# Patient Record
Sex: Female | Born: 1975 | Race: White | Hispanic: No | Marital: Married | Smoking: Never smoker
Health system: Southern US, Community
[De-identification: ages and names within clinical notes are randomized; demographics above are authoritative.]

## PROBLEM LIST (undated history)

## (undated) DIAGNOSIS — J189 Pneumonia, unspecified organism: Secondary | ICD-10-CM

## (undated) DIAGNOSIS — R519 Headache, unspecified: Secondary | ICD-10-CM

## (undated) DIAGNOSIS — D649 Anemia, unspecified: Secondary | ICD-10-CM

## (undated) HISTORY — PX: DILATION AND CURETTAGE OF UTERUS: SHX78

## (undated) HISTORY — PX: TUBAL LIGATION: SHX77

---

## 2005-10-19 ENCOUNTER — Emergency Department: Payer: Self-pay | Admitting: General Practice

## 2008-05-31 ENCOUNTER — Emergency Department: Payer: Self-pay | Admitting: Emergency Medicine

## 2013-03-16 ENCOUNTER — Emergency Department: Payer: Self-pay | Admitting: Emergency Medicine

## 2013-09-23 ENCOUNTER — Emergency Department: Payer: Self-pay | Admitting: Emergency Medicine

## 2013-09-26 LAB — BETA STREP CULTURE(ARMC)

## 2014-02-02 ENCOUNTER — Emergency Department: Payer: Self-pay | Admitting: Emergency Medicine

## 2014-02-02 LAB — CBC
HCT: 42.3 % (ref 35.0–47.0)
HGB: 13.7 g/dL (ref 12.0–16.0)
MCH: 28.8 pg (ref 26.0–34.0)
MCHC: 32.4 g/dL (ref 32.0–36.0)
MCV: 89 fL (ref 80–100)
PLATELETS: 215 10*3/uL (ref 150–440)
RBC: 4.76 10*6/uL (ref 3.80–5.20)
RDW: 12.9 % (ref 11.5–14.5)
WBC: 9.1 10*3/uL (ref 3.6–11.0)

## 2014-02-02 LAB — URINALYSIS, COMPLETE
Bilirubin,UR: NEGATIVE
Blood: NEGATIVE
Glucose,UR: NEGATIVE mg/dL (ref 0–75)
Hyaline Cast: 3
KETONE: NEGATIVE
Leukocyte Esterase: NEGATIVE
Nitrite: NEGATIVE
PROTEIN: NEGATIVE
Ph: 5 (ref 4.5–8.0)
Specific Gravity: 1.032 (ref 1.003–1.030)
WBC UR: 6 /HPF (ref 0–5)

## 2014-02-02 LAB — COMPREHENSIVE METABOLIC PANEL
ANION GAP: 9 (ref 7–16)
Albumin: 3.3 g/dL — ABNORMAL LOW (ref 3.4–5.0)
Alkaline Phosphatase: 59 U/L
BUN: 13 mg/dL (ref 7–18)
Bilirubin,Total: 0.8 mg/dL (ref 0.2–1.0)
CHLORIDE: 106 mmol/L (ref 98–107)
CREATININE: 0.83 mg/dL (ref 0.60–1.30)
Calcium, Total: 8.5 mg/dL (ref 8.5–10.1)
Co2: 26 mmol/L (ref 21–32)
EGFR (Non-African Amer.): >60
Glucose: 95 mg/dL (ref 65–99)
Osmolality: 281 (ref 275–301)
POTASSIUM: 3.6 mmol/L (ref 3.5–5.1)
SGOT(AST): 19 U/L (ref 15–37)
SGPT (ALT): 34 U/L
Sodium: 141 mmol/L (ref 136–145)
Total Protein: 6.6 g/dL (ref 6.4–8.2)

## 2014-02-02 LAB — LIPASE, BLOOD: LIPASE: 100 U/L (ref 73–393)

## 2014-11-29 ENCOUNTER — Emergency Department
Admission: EM | Admit: 2014-11-29 | Discharge: 2014-11-29 | Discharge status: Against Medical Advice | Payer: Self-pay | Attending: Emergency Medicine | Admitting: Emergency Medicine

## 2014-11-29 ENCOUNTER — Encounter: Payer: Self-pay | Admitting: Emergency Medicine

## 2014-11-29 DIAGNOSIS — R42 Dizziness and giddiness: Secondary | ICD-10-CM | POA: Insufficient documentation

## 2014-11-29 DIAGNOSIS — R110 Nausea: Secondary | ICD-10-CM | POA: Insufficient documentation

## 2014-11-29 DIAGNOSIS — R1032 Left lower quadrant pain: Secondary | ICD-10-CM | POA: Insufficient documentation

## 2014-11-29 MED ORDER — ONDANSETRON 8 MG PO TBDP: 8.0000 mg | ORAL_TABLET | Freq: Once | ORAL | Status: DC

## 2014-11-29 MED ORDER — MECLIZINE HCL 25 MG PO TABS: 50.0000 mg | ORAL_TABLET | Freq: Once | ORAL | Status: DC

## 2014-11-29 MED ORDER — SODIUM CHLORIDE 0.9 % IV BOLUS (SEPSIS): 1000.0000 mL | Freq: Once | INTRAVENOUS | Status: DC

## 2014-11-29 NOTE — ED Provider Notes (Signed)
[facility] Emergency Department Provider Note  ____________________________________________  Time seen: 7:40 AM  I have reviewed the triage vital signs and the nursing notes.   HISTORY  Chief Complaint Dizziness and Nausea    HPI Cathy Cooper is a 39 y.o.  female who complains of moderate intensity left flank pain radiating to her left lower quadrant for the past 2 weeks. This pain is sharp, intermittent lasting a few minutes, no aggravating or alleviating factors. No significant associated symptoms such as vomiting diarrhea dysuria urgency hematuria. She does report some urinary frequency with increased thirst as well. No medical history that she knows of. She also reports that for the last 4 days she has been feeling nauseated and dizzy which she describes as lightheadedness which is worse when she stands up. She does note that she feels generally dizzy all the time although it does worsen when she is upright. No fevers or chills, no headache no chest pain shortness of breath no earache or recent illness.   LMP 1 week ago. Patient is status post tubal ligation . Patient denies vaginal bleeding or discharge or pelvic discomfort   History reviewed. No pertinent past medical history.  There are no active problems to display for this patient.   History reviewed. No pertinent past surgical history.  bilateral tubal ligation  No current outpatient prescriptions on file.  Allergies Review of patient's allergies indicates no known allergies.  History reviewed. No pertinent family history.  Social History History  Substance Use Topics  . Smoking status: Never Smoker   . Smokeless tobacco: Never Used  . Alcohol Use: No    Review of Systems  Constitutional: No fever or chills. No weight changes Eyes:No blurry vision or double vision.  ENT: No sore throat. Cardiovascular: No chest pain. Respiratory: No dyspnea or cough. GastrointestPositive left  lower quadrant pain without vomiting or diarrheaa.  No BRBPR or melena. Genitourinary: Negative for dysuria, urinary retention, bloody urine, or difficulty urinating. positive urinary frequency  Musculoskeletal: Negative for back pain. No joint swelling or pain. Skin: Negative for rash. Neurological: Negative for headaches, focal weakness or numbness. Psychiatric:No anxiety or depression.   Endocrine:No hot/cold intolerance, changes in energy, or sleep difficulty.  10-point ROS otherwise negative.  ____________________________________________   PHYSICAL EXAM:  VITAL SIGNS: ED Triage Vitals  Enc Vitals Group     BP 11/29/14 0737 106/55 mmHg     Pulse Rate 11/29/14 0737 78     Resp 11/29/14 0737 22     Temp 11/29/14 0737 97.6 F (36.4 C)     Temp Source 11/29/14 0737 Oral     SpO2 11/29/14 0737 97 %     Weight 11/29/14 0737 215 lb (97.523 kg)     Height 11/29/14 0737 5' 7"  (1.702 m)     Head Cir --      Peak Flow --      Pain Score --      Pain Loc --      Pain Edu? --      Excl. in [place]? --      Constitutional: Alert and oriented. Well appearing and in no distress. Eyes: No scleral icterus. No conjunctival pallor. PERRL. EOMI ENT   Head: Normocephalic and atraumatic.   Nose: No congestion/rhinnorhea. No septal hematoma   Mouth/ThroatDry mucous membranes, no pharyngeal erythema. No peritonsillar mass. No uvula shift.   Neck: No stridor. No SubQ emphysema. No meningismus. Hematological/Lymphatic/Immunilogical: No cervical lymphadenopathy. Cardiovascular: RRR. Normal and symmetric  distal pulses are present in all extremities. No murmurs, rubs, or gallops. Respiratory: Normal respiratory effort without tachypnea nor retractions. Breath sounds are clear and equal bilaterally. No wheezes/rales/rhonchi. GastrointestSoft with suprapubic and left lower quadrant tenderness. No distention. There is no CVA tenderness.  No rebound, rigidity, or guarding. Genitourinary:  deferred Musculoskeletal: Nontender with normal range of motion in all extremities. No joint effusions.  No lower extremity tenderness.  No edema. Neurologic:   Normal speech and language.  CN 2-10 normal. Motor grossly intact. No pronator drift.  Normal gait. No gross focal neurologic deficits are appreciated.  Skin:  Skin is warm, dry and intact. No rash noted.  No petechiae, purpura, or bullae. Psychiatric: Mood and affect are normal. Speech and behavior are normal. Patient exhibits appropriate insight and judgment.  ____________________________________________    LABS (pertinent positives/negatives) (all labs ordered are listed, but only abnormal results are displayed) Labs Reviewed  BASIC METABOLIC PANEL  CBC WITH DIFFERENTIAL/PLATELET  URINALYSIS COMPLETEWITH MICROSCOPIC ([place])  CBG MONITORING, ED  POC URINE PREG, ED   ____________________________________________   EKG    ____________________________________________    RADIOLOGY    ____________________________________________   PROCEDURES  ____________________________________________   INITIAL IMPRESSION / [place] / ED COURSE  Pertinent labs & imaging results that were available during my care of the patient were reviewed by me and considered in my medical decision making (see chart for details).  patient presents with abdominal pain of unclear etiology, possibly urinary tract infection, renal colic. Low suspicion of ectopic or torsion. Low suspicion of appendicitis or cholecystitis or AAA or surgical pathology. As result of this the patient's likely become dehydrated especially with very high ambient temperatures in the 80s recently. She does appear to be mildly dehydrated clinically sober give her IV fluids while getting labs.  ----------------------------------------- 11:29 AM on 11/29/2014 -----------------------------------------  When the nurse went to go start an IV and give  fluids and draw labs, she noted that the patient's gown was on the bed and the patient was not in the room. The patient's belongings were gone. It appears the patient has eloped. By my initial evaluation, the patient does have medical decision-making capacity and is able to make this decision for herself. She did not appear to be in distress is likely medically stable at this time, although workup would've been helpful to evaluate this. __________________________________________   FINAL CLINICAL IMPRESSION(S) / ED DIAGNOSES  Final diagnoses:  Abdominal pain, left lower quadrant      Carrie Mew, MD 11/29/14 1130

## 2014-11-29 NOTE — ED Notes (Signed)
Patient to ED with c/o dizziness and nausea since Thursday, reports room is spinning whenever she gets up. Patient also reports Left flank/LLQ pain.

## 2016-03-14 ENCOUNTER — Emergency Department
Admission: EM | Admit: 2016-03-14 | Discharge: 2016-03-14 | Disposition: A | Discharge status: Home / Self Care | Payer: BLUE CROSS/BLUE SHIELD | Attending: Emergency Medicine | Admitting: Emergency Medicine

## 2016-03-14 ENCOUNTER — Encounter: Payer: Self-pay | Admitting: Emergency Medicine

## 2016-03-14 ENCOUNTER — Emergency Department: Payer: BLUE CROSS/BLUE SHIELD

## 2016-03-14 DIAGNOSIS — R1032 Left lower quadrant pain: Secondary | ICD-10-CM | POA: Diagnosis not present

## 2016-03-14 DIAGNOSIS — R52 Pain, unspecified: Secondary | ICD-10-CM

## 2016-03-14 LAB — URINALYSIS COMPLETE WITH MICROSCOPIC (ARMC ONLY)
BACTERIA UA: NONE SEEN
Bilirubin Urine: NEGATIVE
Glucose, UA: NEGATIVE mg/dL
KETONES UR: NEGATIVE mg/dL
Leukocytes, UA: NEGATIVE
NITRITE: NEGATIVE
PH: 7 (ref 5.0–8.0)
PROTEIN: NEGATIVE mg/dL
Specific Gravity, Urine: 1.018 (ref 1.005–1.030)

## 2016-03-14 LAB — HEPATIC FUNCTION PANEL
ALT: 21 U/L (ref 14–54)
AST: 21 U/L (ref 15–41)
Albumin: 3.6 g/dL (ref 3.5–5.0)
Alkaline Phosphatase: 45 U/L (ref 38–126)
Bilirubin, Direct: 0.1 mg/dL (ref 0.1–0.5)
Indirect Bilirubin: 0.9 mg/dL (ref 0.3–0.9)
TOTAL PROTEIN: 6.5 g/dL (ref 6.5–8.1)
Total Bilirubin: 1 mg/dL (ref 0.3–1.2)

## 2016-03-14 LAB — WET PREP, GENITAL
Clue Cells Wet Prep HPF POC: NONE SEEN
SPERM: NONE SEEN
Trich, Wet Prep: NONE SEEN
Yeast Wet Prep HPF POC: NONE SEEN

## 2016-03-14 LAB — BASIC METABOLIC PANEL
ANION GAP: 6 (ref 5–15)
BUN: 16 mg/dL (ref 6–20)
CALCIUM: 8.5 mg/dL — AB (ref 8.9–10.3)
CO2: 24 mmol/L (ref 22–32)
Chloride: 110 mmol/L (ref 101–111)
Creatinine, Ser: 0.72 mg/dL (ref 0.44–1.00)
GFR calc Af Amer: >60 mL/min (ref >60)
GLUCOSE: 89 mg/dL (ref 65–99)
Potassium: 4.1 mmol/L (ref 3.5–5.1)
SODIUM: 140 mmol/L (ref 135–145)

## 2016-03-14 LAB — CBC
HCT: 39.3 % (ref 35.0–47.0)
HEMOGLOBIN: 14.1 g/dL (ref 12.0–16.0)
MCH: 30.6 pg (ref 26.0–34.0)
MCHC: 35.8 g/dL (ref 32.0–36.0)
MCV: 85.5 fL (ref 80.0–100.0)
Platelets: 227 10*3/uL (ref 150–440)
RBC: 4.6 MIL/uL (ref 3.80–5.20)
RDW: 12.6 % (ref 11.5–14.5)
WBC: 6.2 10*3/uL (ref 3.6–11.0)

## 2016-03-14 LAB — CHLAMYDIA/NGC RT PCR (ARMC ONLY)
Chlamydia Tr: NOT DETECTED
N gonorrhoeae: NOT DETECTED

## 2016-03-14 LAB — POCT PREGNANCY, URINE: PREG TEST UR: NEGATIVE

## 2016-03-14 LAB — LIPASE, BLOOD: LIPASE: 24 U/L (ref 11–51)

## 2016-03-14 MED ORDER — ONDANSETRON HCL 4 MG/2ML IJ SOLN
4.0000 mg | Freq: Once | INTRAMUSCULAR | Status: AC
  Administered 2016-03-14: 4 mg via INTRAVENOUS
  Filled 2016-03-14: qty 2

## 2016-03-14 MED ORDER — SODIUM CHLORIDE 0.9 % IV BOLUS (SEPSIS)
500.0000 mL | Freq: Once | INTRAVENOUS | Status: AC
  Administered 2016-03-14: 500 mL via INTRAVENOUS

## 2016-03-14 MED ORDER — MORPHINE SULFATE (PF) 4 MG/ML IV SOLN
4.0000 mg | Freq: Once | INTRAVENOUS | Status: AC
  Administered 2016-03-14: 4 mg via INTRAVENOUS
  Filled 2016-03-14: qty 1

## 2016-03-14 MED ORDER — KETOROLAC TROMETHAMINE 30 MG/ML IJ SOLN
15.0000 mg | Freq: Once | INTRAMUSCULAR | Status: AC
  Administered 2016-03-14: 15 mg via INTRAMUSCULAR
  Filled 2016-03-14: qty 1

## 2016-03-14 MED ORDER — IOPAMIDOL (ISOVUE-300) INJECTION 61%
30.0000 mL | Freq: Once | INTRAVENOUS | Status: AC
  Administered 2016-03-14: 30 mL via ORAL

## 2016-03-14 MED ORDER — IOPAMIDOL (ISOVUE-300) INJECTION 61%
100.0000 mL | Freq: Once | INTRAVENOUS | Status: AC | PRN
  Administered 2016-03-14: 100 mL via INTRAVENOUS

## 2016-03-14 NOTE — ED Provider Notes (Signed)
-----------------------------------------   3:14 PM on 03/14/2016 -----------------------------------------   Blood pressure (!) 114/53, pulse 92, temperature 98 F (36.7 C), temperature source Oral, resp. rate 18, height 5' 7"  (1.702 m), weight 215 lb (97.5 kg), last menstrual period 03/12/2016, SpO2 99 %.  Assuming care from Dr. Edd Fabian of [facility] is a 40 y.o. female with a chief complaint of Flank Pain .    In summary, 34F who presents for evaluation of 2 weeks of left lower quadrant abdominal pain. Labs WNL. VS stable. Patient with moderate ttp over the LLQ per Dr. Edd Fabian.  Plan for pelvic US and if negative to pursue CT a/p.  _________________________ 7:00 PM on 03/14/2016 ----------------------------------------- CT negative for acute findings. I re-examined patient and abdomen is soft and non tender to palpation. VS stable. Patient will be discharged home with follow up at [facility] clinic.  I discussed my evaluation of the patient's symptoms, my clinical impression, and my proposed outpatient treatment plan with patient. We have discussed anticipatory guidance, scheduled follow-up, and careful return precautions. The patient expresses understanding and is comfortable with the discharge plan. All patient's questions were answered.      Rudene Re, MD 03/14/16 1902

## 2016-03-14 NOTE — ED Notes (Signed)
Pt returned from US

## 2016-03-14 NOTE — ED Notes (Signed)
Pt advised that she just used the restroom and did not feel she needed to go at the time of triage.

## 2016-03-14 NOTE — ED Notes (Signed)
Pt transported to ultrasound.

## 2016-03-14 NOTE — ED Notes (Signed)

## 2016-03-14 NOTE — ED Triage Notes (Signed)
Pt c/o left flank pain that radiates to left groin. Denies NVD. Denies urinary sx. Denies vaginal discharge.

## 2016-03-14 NOTE — ED Provider Notes (Signed)
[facility] Emergency Department Provider Note   ____________________________________________   First MD Initiated Contact with Patient 03/14/16 1245     (approximate)  I have reviewed the triage vital signs and the nursing notes.   HISTORY  Chief Complaint Flank Pain    HPI Cathy Cooper is a 40 y.o. female with no chronic medical problems who presents for evaluation of 2 weeks of left lower quadrant pain wrapping around to the left flank, worse when she walks, currently moderate. She denies any nausea, vomiting, diarrhea, fevers or chills. No pain or burning with urination, no hematuria. No abnormal vaginal bleeding or vaginal discharge. Menstrual period started 2 days ago, currently menstruating. She denies any concern for sexual transmitted infection stating that she is in a mutually monogamous sexual relationship with her husband.   History reviewed. No pertinent past medical history.  There are no active problems to display for this patient.   History reviewed. No pertinent surgical history.  Prior to Admission medications   Not on File    Allergies Review of patient's allergies indicates no known allergies.  History reviewed. No pertinent family history.  Social History Social History  Substance Use Topics  . Smoking status: Never Smoker  . Smokeless tobacco: Never Used  . Alcohol use No    Review of Systems Constitutional: No fever/chills Eyes: No visual changes. ENT: No sore throat. Cardiovascular: Denies chest pain. Respiratory: Denies shortness of breath. Gastrointestinal: + abdominal pain.  No nausea, no vomiting.  No diarrhea.  No constipation. Genitourinary: Negative for dysuria. Musculoskeletal: Negative for back pain. Skin: Negative for rash. Neurological: Negative for headaches, focal weakness or numbness.  10-point ROS otherwise negative.  ____________________________________________   PHYSICAL  EXAM:  VITAL SIGNS: ED Triage Vitals  Enc Vitals Group     BP 03/14/16 1057 (!) 114/53     Pulse Rate 03/14/16 1057 92     Resp 03/14/16 1057 18     Temp 03/14/16 1057 98 F (36.7 C)     Temp Source 03/14/16 1057 Oral     SpO2 03/14/16 1057 99 %     Weight 03/14/16 1056 215 lb (97.5 kg)     Height 03/14/16 1056 5' 7"  (1.702 m)     Head Circumference --      Peak Flow --      Pain Score 03/14/16 1056 8     Pain Loc --      Pain Edu? --      Excl. in [employer]? --     Constitutional: Alert and oriented. Well appearing and in no acute distress. Eyes: Conjunctivae are normal. PERRL. EOMI. Head: Atraumatic. Nose: No congestion/rhinnorhea. Mouth/Throat: Mucous membranes are moist.  Oropharynx non-erythematous. Neck: No stridor.  Supple without meningismus. Cardiovascular: Normal rate, regular rhythm. Grossly normal heart sounds.  Good peripheral circulation. Respiratory: Normal respiratory effort.  No retractions. Lungs CTAB. Gastrointestinal: Soft with tenderness to palpation in the left lower quadrant and over the left iliac crest. No CVA tenderness. Pelvic: small amount of dark bloody mucous from clsed OS, No bimanual or cervical motion tenderness. Musculoskeletal: No lower extremity tenderness nor edema.  No joint effusions. Neurologic:  Normal speech and language. No gross focal neurologic deficits are appreciated. No gait instability. Skin:  Skin is warm, dry and intact. No rash noted. Psychiatric: Mood and affect are normal. Speech and behavior are normal.  ____________________________________________   LABS (all labs ordered are listed, but only abnormal results are displayed)  Labs  Reviewed  WET PREP, GENITAL - Abnormal; Notable for the following:       Result Value   WBC, Wet Prep HPF POC MODERATE (*)    All other components within normal limits  URINALYSIS COMPLETEWITH MICROSCOPIC ([facility] ONLY) - Abnormal; Notable for the following:    Color, Urine YELLOW (*)     APPearance CLOUDY (*)    Hgb urine dipstick 1+ (*)    Squamous Epithelial / LPF 0-5 (*)    All other components within normal limits  BASIC METABOLIC PANEL - Abnormal; Notable for the following:    Calcium 8.5 (*)    All other components within normal limits  CHLAMYDIA/NGC RT PCR ([facility] ONLY)  CBC  HEPATIC FUNCTION PANEL  LIPASE, BLOOD  POC URINE PREG, ED  POCT PREGNANCY, URINE   ____________________________________________  EKG  none ____________________________________________  RADIOLOGY  Transvaginal ultrasound - pending ____________________________________________   PROCEDURES  Procedure(s) performed: None  Procedures  Critical Care performed: No  ____________________________________________   INITIAL IMPRESSION / ASSESSMENT AND PLAN / ED COURSE  Pertinent labs & imaging results that were available during my care of the patient were reviewed by me and considered in my medical decision making (see chart for details).  Cathy Cooper is a 40 y.o. female with no chronic medical problems who presents for evaluation of 2 weeks of left lower quadrant pain wrapping around to the left flank, worse when she walks. On exam, she is well-appearing and in no acute distress. Vital signs stable, she is afebrile. She does have tenderness to palpation in the left lower quadrant, over the iliac crest as well. No rebound or guarding. No appreciable hernia sac or bulge. We'll obtain screening labs including urinalysis, urine pregnancy test, transvaginal ultrasound to evaluate for any acute ovarian pathology such as torsion, treat her symptomatically and reassess for disposition.  ----------------------------------------- 3:01 PM on 03/14/2016 ----------------------------------------- CBC, CMP , Lipase unremarkable. Preg negative. Wet prep negative. UA not consistent with infection. Awaiting ultrasound, care transferred to Dr. Alfred Levins.   Clinical Course      ____________________________________________   FINAL CLINICAL IMPRESSION(S) / ED DIAGNOSES  Final diagnoses:  Pain  LLQ pain      NEW MEDICATIONS STARTED DURING THIS VISIT:  New Prescriptions   No medications on file     Note:  This document was prepared using Dragon voice recognition software and may include unintentional dictation errors.    Joanne Gavel, MD 03/14/16 [phone]

## 2016-03-14 NOTE — ED Notes (Signed)
Patient transported to Ultrasound 

## 2016-03-14 NOTE — Discharge Instructions (Signed)

## 2016-05-17 ENCOUNTER — Emergency Department: Payer: BLUE CROSS/BLUE SHIELD

## 2016-05-17 ENCOUNTER — Emergency Department
Admission: EM | Admit: 2016-05-17 | Discharge: 2016-05-17 | Disposition: A | Discharge status: Home / Self Care | Payer: BLUE CROSS/BLUE SHIELD | Attending: Emergency Medicine | Admitting: Emergency Medicine

## 2016-05-17 DIAGNOSIS — R197 Diarrhea, unspecified: Secondary | ICD-10-CM | POA: Insufficient documentation

## 2016-05-17 DIAGNOSIS — R112 Nausea with vomiting, unspecified: Secondary | ICD-10-CM

## 2016-05-17 DIAGNOSIS — G43809 Other migraine, not intractable, without status migrainosus: Secondary | ICD-10-CM

## 2016-05-17 LAB — URINALYSIS, COMPLETE (UACMP) WITH MICROSCOPIC
BACTERIA UA: NONE SEEN
BILIRUBIN URINE: NEGATIVE
Glucose, UA: NEGATIVE mg/dL
HGB URINE DIPSTICK: NEGATIVE
Ketones, ur: NEGATIVE mg/dL
LEUKOCYTES UA: NEGATIVE
Nitrite: NEGATIVE
PROTEIN: NEGATIVE mg/dL
Specific Gravity, Urine: 1.027 (ref 1.005–1.030)
pH: 6 (ref 5.0–8.0)

## 2016-05-17 LAB — INFLUENZA PANEL BY PCR (TYPE A & B)
INFLAPCR: NEGATIVE
Influenza B By PCR: NEGATIVE

## 2016-05-17 LAB — CBC
HEMATOCRIT: 40.6 % (ref 35.0–47.0)
Hemoglobin: 14 g/dL (ref 12.0–16.0)
MCH: 30.2 pg (ref 26.0–34.0)
MCHC: 34.5 g/dL (ref 32.0–36.0)
MCV: 87.6 fL (ref 80.0–100.0)
Platelets: 228 10*3/uL (ref 150–440)
RBC: 4.64 MIL/uL (ref 3.80–5.20)
RDW: 12.8 % (ref 11.5–14.5)
WBC: 8.2 10*3/uL (ref 3.6–11.0)

## 2016-05-17 LAB — BASIC METABOLIC PANEL
Anion gap: 5 (ref 5–15)
BUN: 15 mg/dL (ref 6–20)
CHLORIDE: 107 mmol/L (ref 101–111)
CO2: 24 mmol/L (ref 22–32)
Calcium: 8.3 mg/dL — ABNORMAL LOW (ref 8.9–10.3)
Creatinine, Ser: 0.61 mg/dL (ref 0.44–1.00)
GFR calc Af Amer: >60 mL/min (ref >60)
GFR calc non Af Amer: >60 mL/min (ref >60)
Glucose, Bld: 122 mg/dL — ABNORMAL HIGH (ref 65–99)
POTASSIUM: 3.4 mmol/L — AB (ref 3.5–5.1)
SODIUM: 136 mmol/L (ref 135–145)

## 2016-05-17 LAB — POCT PREGNANCY, URINE: Preg Test, Ur: NEGATIVE

## 2016-05-17 MED ORDER — KETOROLAC TROMETHAMINE 30 MG/ML IJ SOLN
30.0000 mg | Freq: Once | INTRAMUSCULAR | Status: AC
  Administered 2016-05-17: 30 mg via INTRAVENOUS
  Filled 2016-05-17: qty 1

## 2016-05-17 MED ORDER — METOCLOPRAMIDE HCL 5 MG/ML IJ SOLN
10.0000 mg | Freq: Once | INTRAMUSCULAR | Status: AC
  Administered 2016-05-17: 10 mg via INTRAVENOUS
  Filled 2016-05-17 (×2): qty 2

## 2016-05-17 MED ORDER — METOCLOPRAMIDE HCL 5 MG PO TABS: 5.0000 mg | ORAL_TABLET | Freq: Four times a day (QID) | ORAL | 0 refills | Status: DC | PRN

## 2016-05-17 MED ORDER — LOPERAMIDE HCL 2 MG PO TABS: 2.0000 mg | ORAL_TABLET | Freq: Four times a day (QID) | ORAL | 0 refills | Status: DC | PRN

## 2016-05-17 MED ORDER — KETOROLAC TROMETHAMINE 10 MG PO TABS: 10.0000 mg | ORAL_TABLET | Freq: Three times a day (TID) | ORAL | 0 refills | Status: DC | PRN

## 2016-05-17 MED ORDER — SODIUM CHLORIDE 0.9 % IV BOLUS (SEPSIS)
1000.0000 mL | Freq: Once | INTRAVENOUS | Status: AC
  Administered 2016-05-17: 1000 mL via INTRAVENOUS

## 2016-05-17 NOTE — Discharge Instructions (Signed)
Please drink plenty of fluids stay well-hydrated. You may take loperamide for diarrhea, and Reglan for nausea. Toradol was for mild to moderate pain. Do not take Toradol with other NSAID medications including Motrin, Aleve, ibuprofen or Advil.  Return to the emergency department if he develops severe pain, fever, inability to keep down fluids, or any other symptoms concerning to you.

## 2016-05-17 NOTE — ED Triage Notes (Addendum)
Pt presents to ED with c/o headache, N/V/D, and generalized extremity weakness x2 days. Pt reports feeling warm, but denies actually taking her temperature. Pt reports s/x's started 2 days ago, with generalized body weakness, 5 episodes of emesis, and 9 episodes of diarrhea in the last 24 hrs. Pt denies any change in bowel or bladder habits (other than recent diarrhea); denies any recent sick contacts.

## 2016-05-17 NOTE — ED Provider Notes (Signed)
[facility] Emergency Department Provider Note  ____________________________________________  Time seen: Approximately 8:40 PM  I have reviewed the triage vital signs and the nursing notes.   HISTORY  Chief Complaint Headache; Emesis; Diarrhea; and Fatigue    HPI Cathy Cooper is a 40 y.o. female with a history of migraines presenting with headache, bilateral arm tingling and weakness, photophobia, phonophobia, nausea vomiting and diarrhea. The patient's symptoms started 2-3 days ago and have gotten progressively worse. She denies any fever, chills, blurred or double vision, difficulty walking, cough or cold symptoms. No abdominal pain.   History reviewed. No pertinent past medical history.  There are no active problems to display for this patient.   History reviewed. No pertinent surgical history.    Allergies Patient has no known allergies.  No family history on file.  Social History Social History  Substance Use Topics  . Smoking status: Never Smoker  . Smokeless tobacco: Never Used  . Alcohol use No    Review of Systems Constitutional: No fever/chills.No lightheadedness or syncope. Eyes:  No blurred or double vision. Positive photophobia. ENT: No sore throat. No congestion or rhinorrhea. No ear pain. Cardiovascular: Denies chest pain. Denies palpitations. Respiratory: Denies shortness of breath.  No cough. Gastrointestinal: No abdominal pain.  Positive nausea, positive vomiting.  Positive diarrhea.  No constipation. Genitourinary: Negative for dysuria. Musculoskeletal: Negative for back pain. Skin: Negative for rash. Neurological: Positive for headaches. Positive bilateral right greater than left upper extremity tingling without numbness but associated with weakness. 10-point ROS otherwise negative.  ____________________________________________   PHYSICAL EXAM:  VITAL SIGNS: ED Triage Vitals [05/17/16 1920]  Enc Vitals Group      BP 106/61     Pulse Rate 99     Resp 16     Temp 98.6 F (37 C)     Temp Source Oral     SpO2 97 %     Weight 215 lb (97.5 kg)     Height 5' 7"  (1.702 m)     Head Circumference      Peak Flow      Pain Score 8     Pain Loc      Pain Edu?      Excl. in [place]?      Constitutional: Alert and oriented. Uncomfortable appearing but nontoxic.Marland Kitchen Answers questions appropriately. Eyes: Conjunctivae are normal.  EOMI. PERRLA. No scleral icterus. Head: Atraumatic. Nose: No congestion/rhinnorhea. Mouth/Throat: Mucous membranes are moist.  Neck: No stridor.  Supple.  No meningismus. Cardiovascular: Normal rate, regular rhythm. No murmurs, rubs or gallops.  Respiratory: Normal respiratory effort.  No accessory muscle use or retractions. Lungs CTAB.  No wheezes, rales or ronchi. Gastrointestinal: Overweight. Soft, nontender and nondistended.  No guarding or rebound.  No peritoneal signs. Musculoskeletal: No LE edema.  Neurologic: Alert and oriented 3. Speech is clear.  Face and smile symmetric. Tongue is midline. EOMI. PERRLA. No horizontal or vertical nystagmus. No pronator drift. 5 out of 5 grip, biceps, triceps, hip flexors, plantar flexion and dorsiflexion. Normal sensation to light touch in the bilateral upper and lower extremities, and face. Normal gait without ataxia. Skin:  Skin is warm, dry and intact. No rash noted. Psychiatric: Mood and affect are normal. Speech and behavior are normal.  Normal judgement.  ____________________________________________   LABS (all labs ordered are listed, but only abnormal results are displayed)  Labs Reviewed  BASIC METABOLIC PANEL - Abnormal; Notable for the following:       Result  Value   Potassium 3.4 (*)    Glucose, Bld 122 (*)    Calcium 8.3 (*)    All other components within normal limits  URINALYSIS, COMPLETE ([facility]) WITH MICROSCOPIC - Abnormal; Notable for the following:    Color, Urine YELLOW (*)    APPearance CLEAR (*)    Squamous  Epithelial / LPF 6-30 (*)    All other components within normal limits  CBC  INFLUENZA PANEL BY PCR (TYPE A & B, H1N1)  POC URINE PREG, ED  POCT PREGNANCY, URINE   ____________________________________________  EKG  Not indicated ____________________________________________  RADIOLOGY  Ct Head Wo Contrast  Result Date: 05/17/2016 CLINICAL DATA:  Headache and bilateral arm tingling. Nausea and vomiting. Weakness. EXAM: CT HEAD WITHOUT CONTRAST TECHNIQUE: Contiguous axial images were obtained from the base of the skull through the vertex without intravenous contrast. COMPARISON:  None. FINDINGS: Brain: No evidence of acute infarction, hemorrhage, hydrocephalus, extra-axial collection or mass lesion/mass effect. Vascular: No hyperdense vessel or unexpected calcification. Skull: Normal. Negative for fracture or focal lesion. Sinuses/Orbits: Paranasal sinuses and mastoid air cells are clear. The visualized orbits are unremarkable. Other: None. IMPRESSION: No acute intracranial abnormality. Electronically Signed   By: Jeb Levering M.D.   On: 05/17/2016 21:14    ____________________________________________   PROCEDURES  Procedure(s) performed: None  Procedures  Critical Care performed: No ____________________________________________   INITIAL IMPRESSION / ASSESSMENT AND PLAN / ED COURSE  Pertinent labs & imaging results that were available during my care of the patient were reviewed by me and considered in my medical decision making (see chart for details).  40 y.o. female with a history of migraines presenting with 3 days of generalized malaise, headache, photophobia, phonophobia, vomiting and diarrhea. The patient has reassuring vital signs and is afebrile. She does not have signs and symptoms that would be consistent with meningitis or intracranial bleeding. However, given that she has had intermittent tingling in the upper extremities, August CT scan of the head. At this time,  the patient is neurologically intact. I will also check the patient for flu. Her urinalysis is negative for infection and her electrolytes are reassuring. I will initiate symptomatically treatment and reevaluate the patient for final disposition.  ----------------------------------------- 9:35 PM on 05/17/2016 -----------------------------------------  The patient's pain has him as completely resolved, and she is no longer nauseated. She is tolerating liquid by mouth. Her CT scan does not show any acute process and her flu test is negative. Plan discharge home with close PMD follow-up. She understands return precautions.  ____________________________________________  FINAL CLINICAL IMPRESSION(S) / ED DIAGNOSES  Final diagnoses:  Other migraine without status migrainosus, not intractable  Nausea vomiting and diarrhea    Clinical Course       NEW MEDICATIONS STARTED DURING THIS VISIT:  New Prescriptions   KETOROLAC (TORADOL) 10 MG TABLET    Take 1 tablet (10 mg total) by mouth every 8 (eight) hours as needed for moderate pain (with food).   METOCLOPRAMIDE (REGLAN) 5 MG TABLET    Take 1 tablet (5 mg total) by mouth every 6 (six) hours as needed for nausea or vomiting.      Eula Listen, MD 05/17/16 2138

## 2017-01-31 ENCOUNTER — Emergency Department
Admission: EM | Admit: 2017-01-31 | Discharge: 2017-01-31 | Disposition: A | Discharge status: Home / Self Care | Payer: BLUE CROSS/BLUE SHIELD | Attending: Student in an Organized Health Care Education/Training Program | Admitting: Student in an Organized Health Care Education/Training Program

## 2017-01-31 DIAGNOSIS — J029 Acute pharyngitis, unspecified: Secondary | ICD-10-CM | POA: Insufficient documentation

## 2017-01-31 DIAGNOSIS — R05 Cough: Secondary | ICD-10-CM | POA: Insufficient documentation

## 2017-01-31 DIAGNOSIS — Z79899 Other long term (current) drug therapy: Secondary | ICD-10-CM | POA: Insufficient documentation

## 2017-01-31 LAB — POCT RAPID STREP A: STREPTOCOCCUS, GROUP A SCREEN (DIRECT): NEGATIVE

## 2017-01-31 MED ORDER — FLUTICASONE PROPIONATE 50 MCG/ACT NA SUSP: 1.0000 | Freq: Two times a day (BID) | NASAL | 0 refills | Status: DC

## 2017-01-31 MED ORDER — DEXAMETHASONE SODIUM PHOSPHATE 10 MG/ML IJ SOLN
20.0000 mg | Freq: Once | INTRAMUSCULAR | Status: AC
  Administered 2017-01-31: 20 mg via INTRAMUSCULAR
  Filled 2017-01-31: qty 2

## 2017-01-31 MED ORDER — MAGIC MOUTHWASH W/LIDOCAINE: 5.0000 mL | Freq: Four times a day (QID) | ORAL | 0 refills | Status: DC

## 2017-01-31 NOTE — ED Triage Notes (Signed)
Patient c/o sore throat X 3 days.

## 2017-01-31 NOTE — ED Provider Notes (Signed)
[facility] Emergency Department Provider Note  ____________________________________________  Time seen: Approximately 8:06 PM  I have reviewed the triage vital signs and the nursing notes.   HISTORY  Chief Complaint Sore Throat    HPI Cathy Cooper is a 41 y.o. female presents emergency department complaining of sore throat 3 days. She denies any difficulty breathing or swallowing. Patient denies any fevers or chills, nasal congestion, headache, visual changes, chest pain, shortness of breath, abdominal pain, nausea vomiting. Patient reports ear pressure and coughing. Patient has not tried any medications prior to arrival in the emergency department.   History reviewed. No pertinent past medical history.  There are no active problems to display for this patient.   Past Surgical History:  Procedure Laterality Date  . TUBAL LIGATION      Prior to Admission medications   Medication Sig Start Date End Date Taking? Authorizing Provider  fluticasone (FLONASE) 50 MCG/ACT nasal spray Place 1 spray into both nostrils 2 (two) times daily. 01/31/17   Marri Mcneff, Charline Bills, PA-C  ketorolac (TORADOL) 10 MG tablet Take 1 tablet (10 mg total) by mouth every 8 (eight) hours as needed for moderate pain (with food). 05/17/16   Eula Listen, MD  loperamide (IMODIUM A-D) 2 MG tablet Take 1 tablet (2 mg total) by mouth 4 (four) times daily as needed for diarrhea or loose stools. 05/17/16   Eula Listen, MD  magic mouthwash w/lidocaine SOLN Take 5 mLs by mouth 4 (four) times daily. 01/31/17   Cambell Rickenbach, Charline Bills, PA-C  metoCLOPramide (REGLAN) 5 MG tablet Take 1 tablet (5 mg total) by mouth every 6 (six) hours as needed for nausea or vomiting. 05/17/16 05/17/17  Eula Listen, MD    Allergies Patient has no known allergies.  Family History  Problem Relation Age of Onset  . Cancer Mother     Social History Social History  Substance Use  Topics  . Smoking status: Never Smoker  . Smokeless tobacco: Never Used  . Alcohol use No     Review of Systems  Constitutional: No fever/chills Eyes: No visual changes. No discharge ENT: Positive for sore throat and right ear pressure Cardiovascular: no chest pain. Respiratory: Positive cough. No SOB. Gastrointestinal: No abdominal pain.  No nausea, no vomiting. Musculoskeletal: Negative for musculoskeletal pain. Skin: Negative for rash, abrasions, lacerations, ecchymosis. Neurological: Negative for headaches, focal weakness or numbness. 10-point ROS otherwise negative.  ____________________________________________   PHYSICAL EXAM:  VITAL SIGNS: ED Triage Vitals  Enc Vitals Group     BP 01/31/17 1944 (!) 113/53     Pulse Rate 01/31/17 1944 87     Resp --      Temp 01/31/17 1944 98.2 F (36.8 C)     Temp Source 01/31/17 1944 Oral     SpO2 01/31/17 1944 99 %     Weight 01/31/17 1945 220 lb (99.8 kg)     Height 01/31/17 1945 5' 7"  (1.702 m)     Head Circumference --      Peak Flow --      Pain Score 01/31/17 1944 6     Pain Loc --      Pain Edu? --      Excl. in [place]? --      Constitutional: Alert and oriented. Well appearing and in no acute distress. Eyes: Conjunctivae are normal. PERRL. EOMI. Head: Atraumatic. ENT:      Ears: EACs unremarkable bilaterally. TM is mildly bulging. TM on left is unremarkable.  Nose: Minimal clear congestion/rhinnorhea.      Mouth/Throat: Mucous membranes are moist. Tonsils are erythematous and moderately edematous. No exudates. Uvula is midline. Neck: No stridor.  No cervical spine tenderness to palpation. Hematological/Lymphatic/Immunilogical: No cervical lymphadenopathy. Cardiovascular: Normal rate, regular rhythm. Normal S1 and S2.  Good peripheral circulation. Respiratory: Normal respiratory effort without tachypnea or retractions. Lungs CTAB. Good air entry to the bases with no decreased or absent breath  sounds. Musculoskeletal: Full range of motion to all extremities. No gross deformities appreciated. Neurologic:  Normal speech and language. No gross focal neurologic deficits are appreciated.  Skin:  Skin is warm, dry and intact. No rash noted. Psychiatric: Mood and affect are normal. Speech and behavior are normal. Patient exhibits appropriate insight and judgement.   ____________________________________________   LABS (all labs ordered are listed, but only abnormal results are displayed)  Labs Reviewed  CULTURE, GROUP A STREP [facility])  POCT RAPID STREP A   ____________________________________________  EKG   ____________________________________________  RADIOLOGY   No results found.  ____________________________________________    PROCEDURES  Procedure(s) performed:    Procedures    Medications  dexamethasone (DECADRON) injection 20 mg (20 mg Intramuscular Given 01/31/17 2026)     ____________________________________________   INITIAL IMPRESSION / ASSESSMENT AND PLAN / ED COURSE  Pertinent labs & imaging results that were available during my care of the patient were reviewed by me and considered in my medical decision making (see chart for details).  Review of the [place] CSRS was performed in accordance of the [place] prior to dispensing any controlled drugs.     Patient's diagnosis is consistent with viral pharyngitis. Strep test is negative. Patient is negative on Cent[facility] criteria for strep. Patient does have erythematous and enlarged tonsils but no exudates. Tonsils are equally edematous. Uvula was midline. This time, no concern for peritonsillar abscess. Symptoms are likely viral in nature.. Patient will be discharged home with prescriptions for Magic mouthwash and Flonase. She is given an injection of Decadron emergency department.. Patient is to follow up with primary care as needed or otherwise directed. Patient is given ED precautions to return to the ED for  any worsening or new symptoms.     ____________________________________________  FINAL CLINICAL IMPRESSION(S) / ED DIAGNOSES  Final diagnoses:  Viral pharyngitis      NEW MEDICATIONS STARTED DURING THIS VISIT:  Discharge Medication List as of 01/31/2017  8:20 PM    START taking these medications   Details  fluticasone (FLONASE) 50 MCG/ACT nasal spray Place 1 spray into both nostrils 2 (two) times daily., Starting Thu 01/31/2017, Print    magic mouthwash w/lidocaine SOLN Take 5 mLs by mouth 4 (four) times daily., Starting Thu 01/31/2017, Print            This chart was dictated using voice recognition software/Dragon. Despite best efforts to proofread, errors can occur which can change the meaning. Any change was purely unintentional.    Brynda Peon 01/31/17 2157    Merlyn Lot, MD 01/31/17 2227

## 2017-02-03 LAB — CULTURE, GROUP A STREP (THRC)

## 2017-08-16 ENCOUNTER — Other Ambulatory Visit: Payer: Self-pay

## 2017-08-16 ENCOUNTER — Emergency Department
Admission: EM | Admit: 2017-08-16 | Discharge: 2017-08-16 | Disposition: A | Discharge status: Home / Self Care | Payer: 59 | Attending: Emergency Medicine | Admitting: Emergency Medicine

## 2017-08-16 ENCOUNTER — Encounter: Payer: Self-pay | Admitting: Emergency Medicine

## 2017-08-16 DIAGNOSIS — J020 Streptococcal pharyngitis: Secondary | ICD-10-CM

## 2017-08-16 DIAGNOSIS — J029 Acute pharyngitis, unspecified: Secondary | ICD-10-CM | POA: Diagnosis present

## 2017-08-16 LAB — GROUP A STREP BY PCR: GROUP A STREP BY PCR: DETECTED — AB

## 2017-08-16 MED ORDER — MAGIC MOUTHWASH W/LIDOCAINE: 5.0000 mL | Freq: Four times a day (QID) | ORAL | 0 refills | Status: DC

## 2017-08-16 MED ORDER — AMOXICILLIN 875 MG PO TABS: 875.0000 mg | ORAL_TABLET | Freq: Two times a day (BID) | ORAL | 0 refills | Status: DC

## 2017-08-16 MED ORDER — AMOXICILLIN 500 MG PO CAPS
1000.0000 mg | ORAL_CAPSULE | Freq: Once | ORAL | Status: AC
  Administered 2017-08-16: 1000 mg via ORAL
  Filled 2017-08-16: qty 2

## 2017-08-16 NOTE — ED Triage Notes (Signed)
Pt arrives ambulatory to triage with c/o sore throat x 2 days. Pt is in NAD.

## 2017-08-16 NOTE — ED Provider Notes (Signed)
[facility] Emergency Department Provider Note  ____________________________________________  Time seen: Approximately 10:04 PM  I have reviewed the triage vital signs and the nursing notes.   HISTORY  Chief Complaint Sore Throat    HPI Cathy Cooper is a 42 y.o. female who presents emergency department for 2-day history of sore throat.  Patient reports fevers, sore throat, intermittent cough.  Patient denies any recent contact with others with illness.  She denies any nasal congestion, difficulty breathing or swallowing, chest pain, shortness of breath, abdominal pain, nausea or vomiting, diarrhea or constipation.  Patient reports that sore throat is sharp in nature.  No other complaints at this time.  Patient is taking Tylenol For this complaint at home.  No other medications.  History reviewed. No pertinent past medical history.  There are no active problems to display for this patient.   Past Surgical History:  Procedure Laterality Date  . TUBAL LIGATION      Prior to Admission medications   Medication Sig Start Date End Date Taking? Authorizing Provider  amoxicillin (AMOXIL) 875 MG tablet Take 1 tablet (875 mg total) by mouth 2 (two) times daily. 08/16/17   Riven Mabile, Charline Bills, PA-C  fluticasone (FLONASE) 50 MCG/ACT nasal spray Place 1 spray into both nostrils 2 (two) times daily. 01/31/17   Azaylea Maves, Charline Bills, PA-C  ketorolac (TORADOL) 10 MG tablet Take 1 tablet (10 mg total) by mouth every 8 (eight) hours as needed for moderate pain (with food). 05/17/16   Eula Listen, MD  loperamide (IMODIUM A-D) 2 MG tablet Take 1 tablet (2 mg total) by mouth 4 (four) times daily as needed for diarrhea or loose stools. 05/17/16   Eula Listen, MD  magic mouthwash w/lidocaine SOLN Take 5 mLs by mouth 4 (four) times daily. 08/16/17   Audie Wieser, Charline Bills, PA-C  metoCLOPramide (REGLAN) 5 MG tablet Take 1 tablet (5 mg total) by mouth every 6 (six)  hours as needed for nausea or vomiting. 05/17/16 05/17/17  Eula Listen, MD    Allergies Patient has no known allergies.  Family History  Problem Relation Age of Onset  . Cancer Mother     Social History Social History   Tobacco Use  . Smoking status: Never Smoker  . Smokeless tobacco: Never Used  Substance Use Topics  . Alcohol use: No  . Drug use: No     Review of Systems  Constitutional: Positive fever/chills Eyes: No visual changes. No discharge ENT: Positive for sore throat times 2 days Cardiovascular: no chest pain. Respiratory: Intermittent cough. No SOB. Gastrointestinal: No abdominal pain.  No nausea, no vomiting.  No diarrhea.  No constipation. Musculoskeletal: Negative for musculoskeletal pain. Skin: Negative for rash, abrasions, lacerations, ecchymosis. Neurological: Negative for headaches, focal weakness or numbness. 10-point ROS otherwise negative.  ____________________________________________   PHYSICAL EXAM:  VITAL SIGNS: ED Triage Vitals  Enc Vitals Group     BP 08/16/17 2010 109/60     Pulse Rate 08/16/17 2010 100     Resp 08/16/17 2010 18     Temp 08/16/17 2010 98.7 F (37.1 C)     Temp Source 08/16/17 2010 Oral     SpO2 08/16/17 2010 99 %     Weight 08/16/17 2012 220 lb (99.8 kg)     Height 08/16/17 2012 5' 7"  (1.702 m)     Head Circumference --      Peak Flow --      Pain Score 08/16/17 2025 8  Pain Loc --      Pain Edu? --      Excl. in [place]? --      Constitutional: Alert and oriented. Well appearing and in no acute distress. Eyes: Conjunctivae are normal. PERRL. EOMI. Head: Atraumatic. ENT:      Ears: EACs and TMs unremarkable bilaterally      Nose: No congestion/rhinnorhea.      Mouth/Throat: Mucous membranes are moist.  Tonsils are erythematous, edematous, exudates bilaterally.  Uvula is midline. Neck: No stridor.  Supple full range of motion Hematological/Lymphatic/Immunilogical: Diffuse, mobile, tender anterior  cervical lymphadenopathy. Cardiovascular: Normal rate, regular rhythm. Normal S1 and S2.  Good peripheral circulation. Respiratory: Normal respiratory effort without tachypnea or retractions. Lungs CTAB. Good air entry to the bases with no decreased or absent breath sounds. Musculoskeletal: Full range of motion to all extremities. No gross deformities appreciated. Neurologic:  Normal speech and language. No gross focal neurologic deficits are appreciated.  Skin:  Skin is warm, dry and intact. No rash noted. Psychiatric: Mood and affect are normal. Speech and behavior are normal. Patient exhibits appropriate insight and judgement.   ____________________________________________   LABS (all labs ordered are listed, but only abnormal results are displayed)  Labs Reviewed  GROUP A STREP BY PCR - Abnormal; Notable for the following components:      Result Value   Group A Strep by PCR DETECTED (*)    All other components within normal limits   ____________________________________________  EKG   ____________________________________________  RADIOLOGY   No results found.  ____________________________________________    PROCEDURES  Procedure(s) performed:    Procedures    Medications  amoxicillin (AMOXIL) capsule 1,000 mg (1,000 mg Oral Given 08/16/17 2230)     ____________________________________________   INITIAL IMPRESSION / ASSESSMENT AND PLAN / ED COURSE  Pertinent labs & imaging results that were available during my care of the patient were reviewed by me and considered in my medical decision making (see chart for details).  Review of the [place] CSRS was performed in accordance of the [place] prior to dispensing any controlled drugs.     Patient's diagnosis is consistent with strep throat.  Initial differential included viral URI, strep, bronchitis, influenza.  Patient was positive for strep testing.  Uvula was midline with no indication of peritonsillar abscess..  Patient will be discharged home with prescriptions for amoxicillin, Magic mouthwash. Patient is to follow up with primary care as needed or otherwise directed. Patient is given ED precautions to return to the ED for any worsening or new symptoms.     ____________________________________________  FINAL CLINICAL IMPRESSION(S) / ED DIAGNOSES  Final diagnoses:  Strep pharyngitis      NEW MEDICATIONS STARTED DURING THIS VISIT:  ED Discharge Orders        Ordered    amoxicillin (AMOXIL) 875 MG tablet  2 times daily     08/16/17 2230    magic mouthwash w/lidocaine SOLN  4 times daily    Comments:  Dispense in a 1/1/1 ratio. Use lidocaine, diphenhydramine, prednisolone   08/16/17 2230          This chart was dictated using voice recognition software/Dragon. Despite best efforts to proofread, errors can occur which can change the meaning. Any change was purely unintentional.    Darletta Moll, PA-C 08/17/17 0024    Harvest Dark, MD 08/19/17 [id number]

## 2018-06-18 IMAGING — CT CT HEAD W/O CM
3 series · 16 of 46 positions shown, 19 images · non-contrast
Comparison: None.

CLINICAL DATA: Headache and bilateral arm tingling. Nausea and
vomiting. Weakness.

EXAM:
CT HEAD WITHOUT CONTRAST
TECHNIQUE: Contiguous axial images were obtained from the base of the skull
through the vertex without intravenous contrast.

[Series 2: head wo · axial · 0.41mm/px · z∈[+504,+624]mm · 10 of 29 slices shown, 13 images]
[im 3/29  brain]
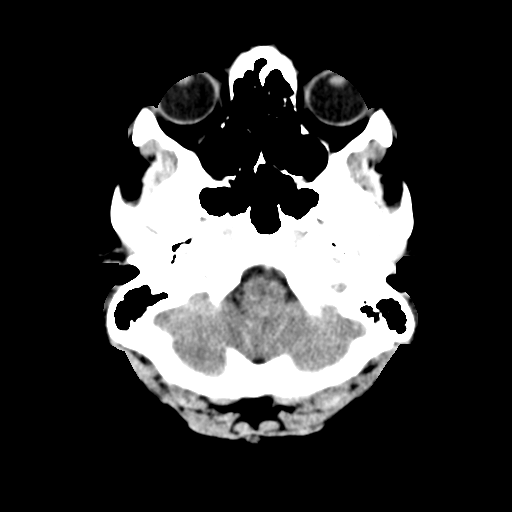
[im 3/29  bone]
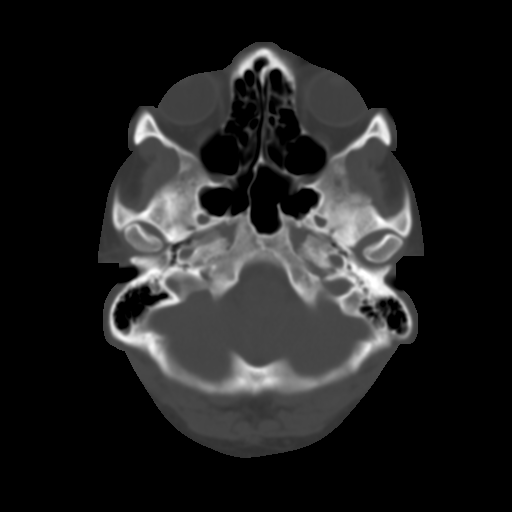
[im 6/29  brain]
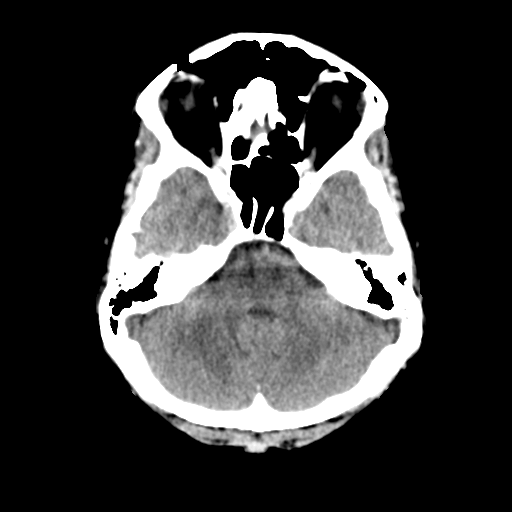
[im 8/29  brain]
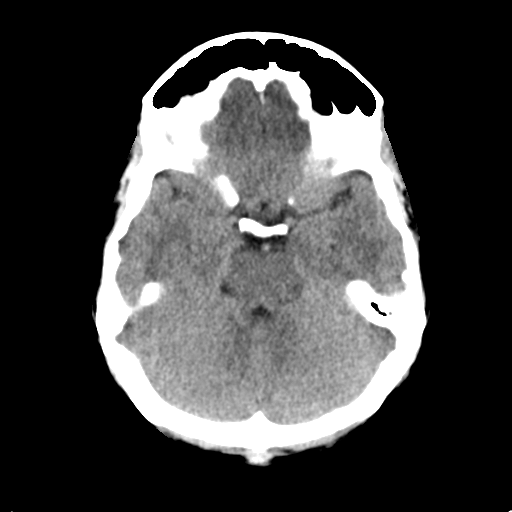
[im 11/29  brain]
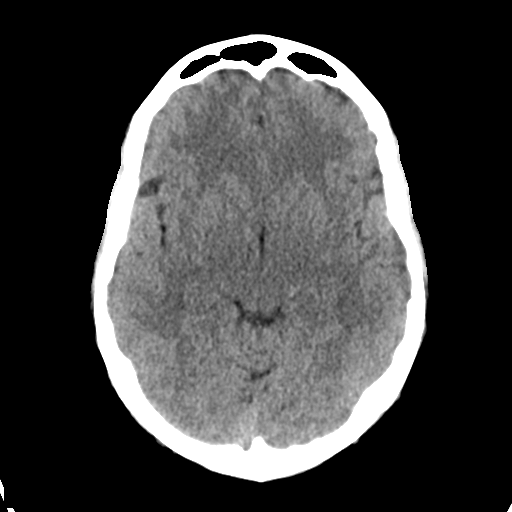
[im 14/29  brain]
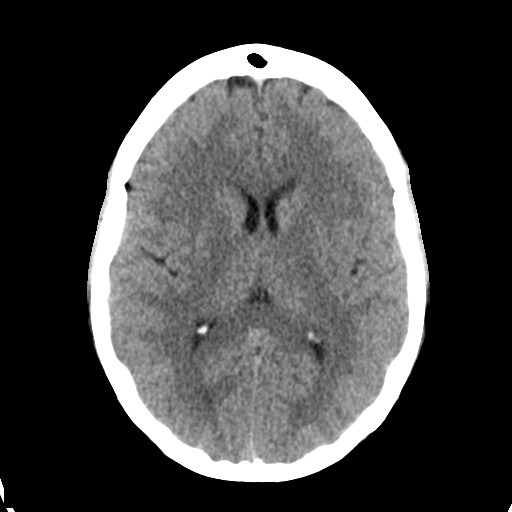
[im 14/29  bone]
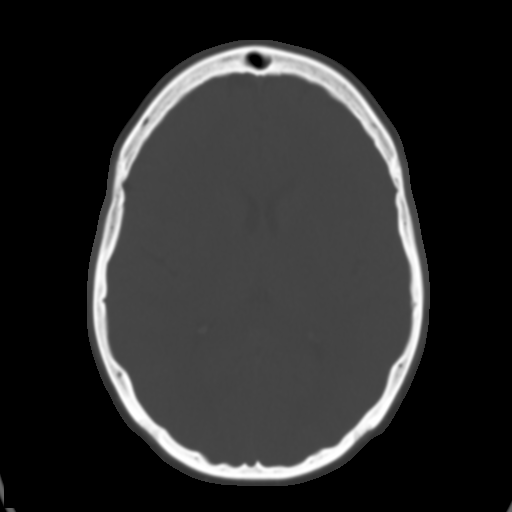
[im 16/29  brain]
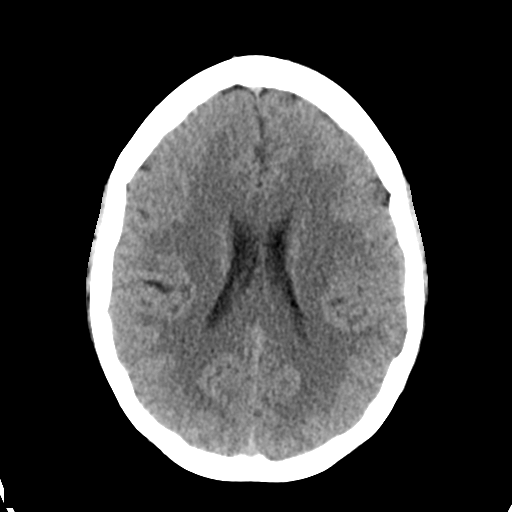
[im 19/29  brain]
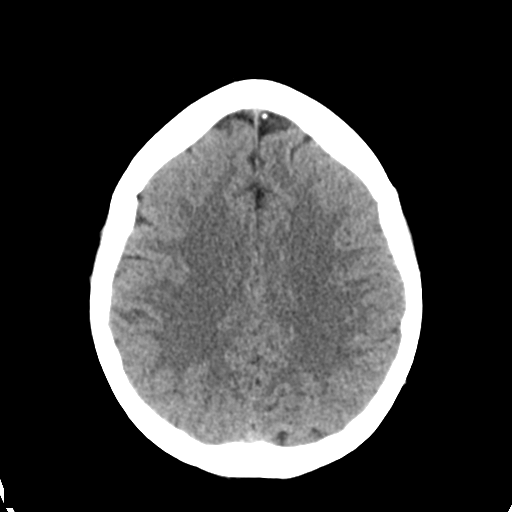
[im 22/29  brain]
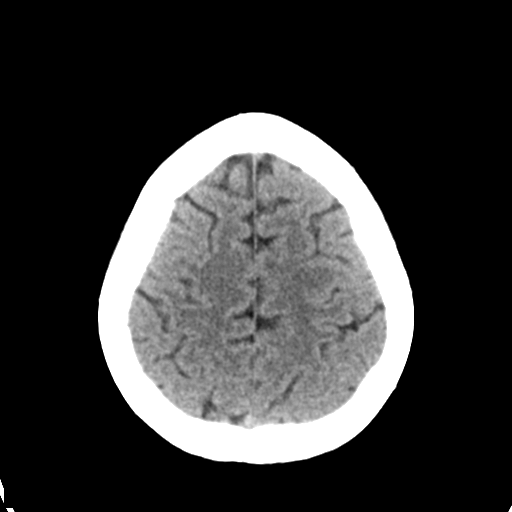
[im 24/29  brain]
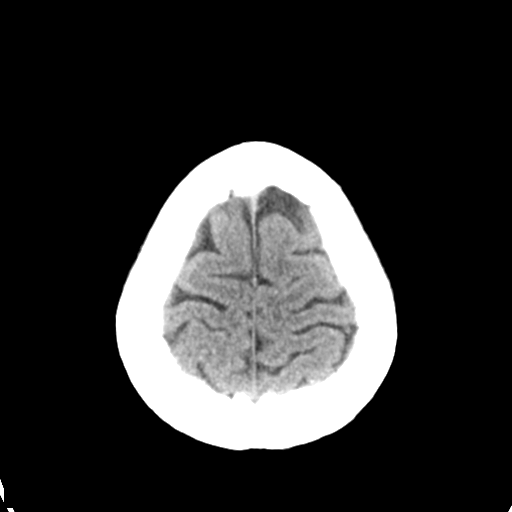
[im 24/29  bone]
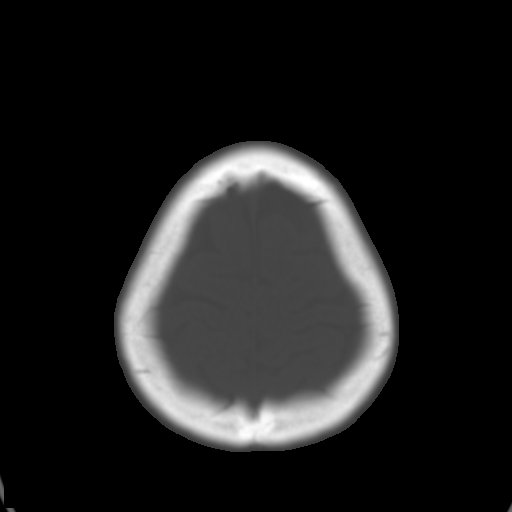
[im 27/29  brain]
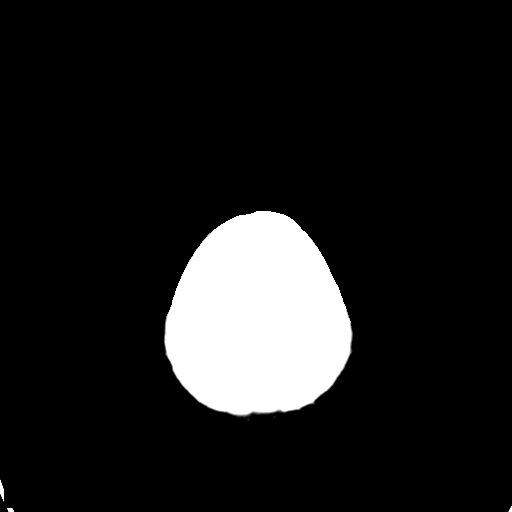

[Series 4: coronal soft tissue · coronal · 0.32mm/px · 3 of 60 slices shown]
[im 20/60  brain]
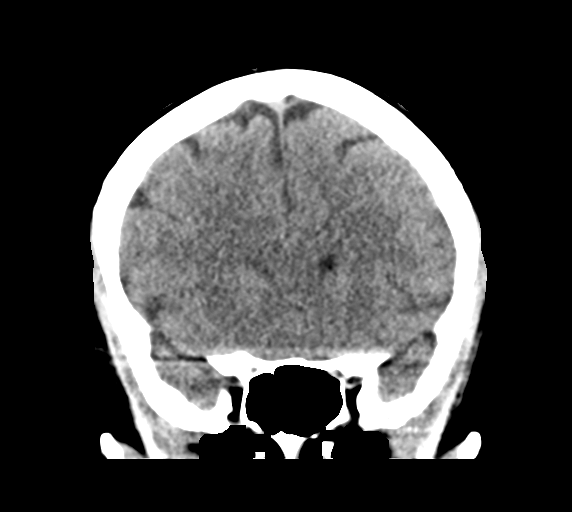
[im 27/60  brain]
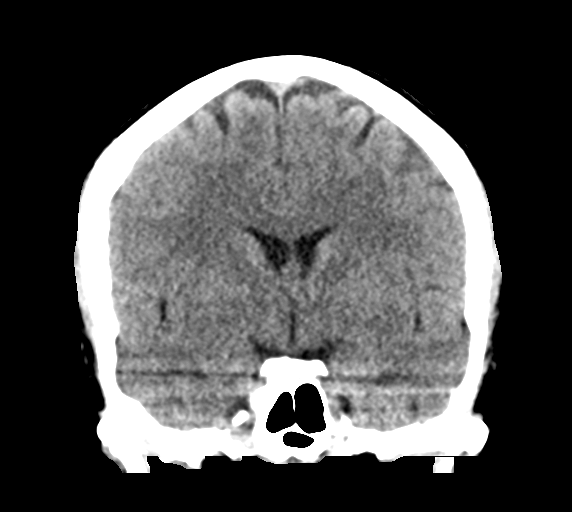
[im 33/60  brain]
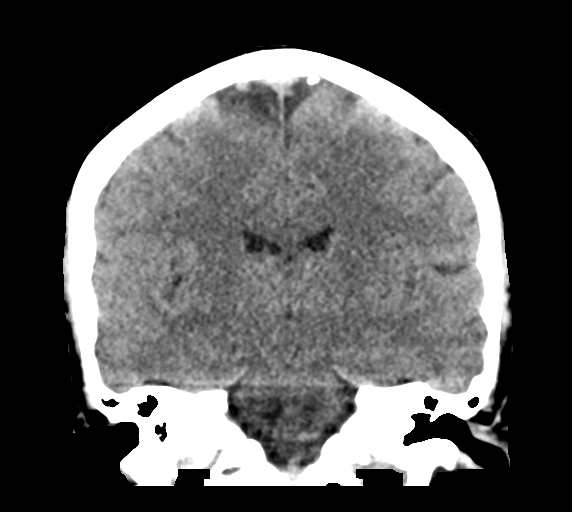

[Series 5: sagittal soft tissue · sagittal · 0.33mm/px · 3 of 50 slices shown]
[im 17/50  brain]
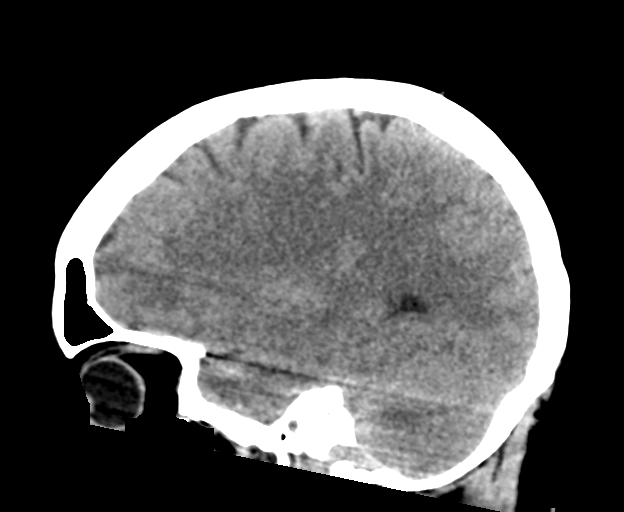
[im 25/50  brain]
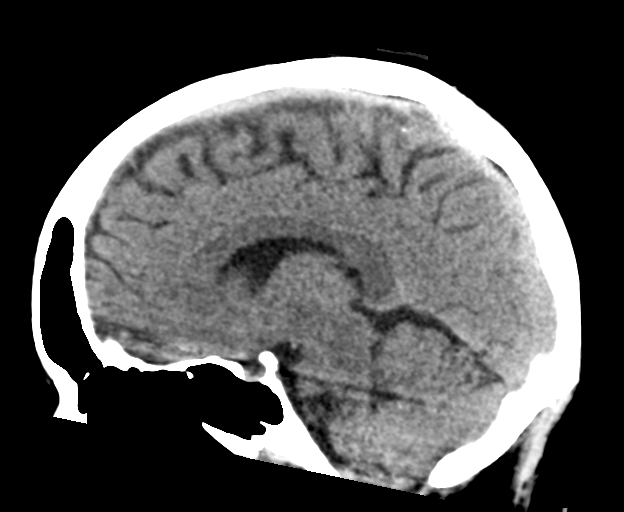
[im 33/50  brain]
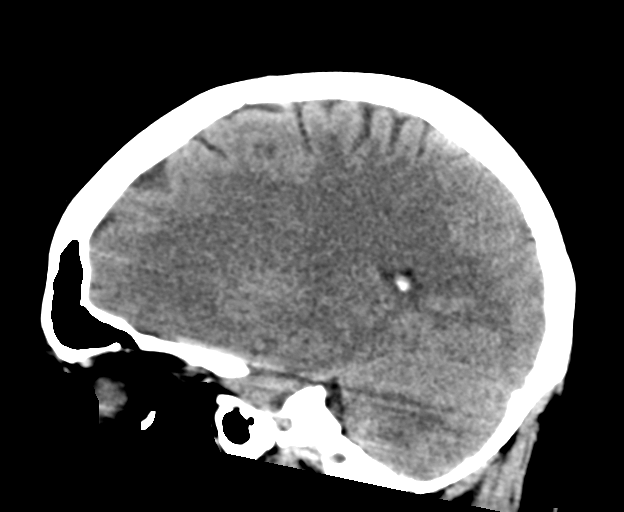

[16 of 46 positions shown; findings below may reference images not displayed]

FINDINGS: Brain: No evidence of acute infarction, hemorrhage, hydrocephalus,
extra-axial collection or mass lesion/mass effect.

Vascular: No hyperdense vessel or unexpected calcification.

Skull: Normal. Negative for fracture or focal lesion.

Sinuses/Orbits: Paranasal sinuses and mastoid air cells are clear.
The visualized orbits are unremarkable.

Other: None.
IMPRESSION: No acute intracranial abnormality.

## 2020-03-09 ENCOUNTER — Telehealth: Payer: Self-pay | Admitting: Emergency Medicine

## 2020-03-09 ENCOUNTER — Emergency Department
Admission: EM | Admit: 2020-03-09 | Discharge: 2020-03-09 | Disposition: A | Discharge status: Home / Self Care | Payer: HRSA Program | Attending: Emergency Medicine | Admitting: Emergency Medicine

## 2020-03-09 ENCOUNTER — Other Ambulatory Visit: Payer: Self-pay

## 2020-03-09 ENCOUNTER — Emergency Department: Payer: HRSA Program

## 2020-03-09 ENCOUNTER — Encounter: Payer: Self-pay | Admitting: Emergency Medicine

## 2020-03-09 DIAGNOSIS — J069 Acute upper respiratory infection, unspecified: Secondary | ICD-10-CM | POA: Diagnosis not present

## 2020-03-09 DIAGNOSIS — Z7951 Long term (current) use of inhaled steroids: Secondary | ICD-10-CM | POA: Insufficient documentation

## 2020-03-09 DIAGNOSIS — J4521 Mild intermittent asthma with (acute) exacerbation: Secondary | ICD-10-CM | POA: Insufficient documentation

## 2020-03-09 DIAGNOSIS — R05 Cough: Secondary | ICD-10-CM | POA: Diagnosis present

## 2020-03-09 DIAGNOSIS — Z20822 Contact with and (suspected) exposure to covid-19: Secondary | ICD-10-CM

## 2020-03-09 DIAGNOSIS — U071 COVID-19: Secondary | ICD-10-CM | POA: Insufficient documentation

## 2020-03-09 LAB — RESPIRATORY PANEL BY RT PCR (FLU A&B, COVID)
Influenza A by PCR: NEGATIVE
Influenza B by PCR: NEGATIVE
SARS Coronavirus 2 by RT PCR: POSITIVE — AB

## 2020-03-09 MED ORDER — PREDNISONE 10 MG PO TABS: ORAL_TABLET | ORAL | 0 refills | Status: AC

## 2020-03-09 MED ORDER — SPACER/AERO-HOLDING CHAMBERS DEVI: 1.0000 | Freq: Once | 0 refills | Status: AC

## 2020-03-09 MED ORDER — ALBUTEROL SULFATE HFA 108 (90 BASE) MCG/ACT IN AERS: 2.0000 | INHALATION_SPRAY | Freq: Four times a day (QID) | RESPIRATORY_TRACT | 2 refills | Status: DC | PRN

## 2020-03-09 NOTE — Telephone Encounter (Signed)
Patient called me back.  Gave her result and explained isolation and quarantine guidelines from cdc.

## 2020-03-09 NOTE — ED Triage Notes (Signed)
Presents with cough   And states she is having some chest discomfort with cough

## 2020-03-09 NOTE — Telephone Encounter (Signed)
Called to inform of covid postive result.  Left message.

## 2020-03-09 NOTE — ED Provider Notes (Signed)
[facility] Emergency Department Provider Note  ____________________________________________   First MD Initiated Contact with Patient 03/09/20 1108     (approximate)  I have reviewed the triage vital signs and the nursing notes.   HISTORY  Chief Complaint Cough  HPI Cathy Cooper is a 44 y.o. female with a history of asthma who presents to the emergency department with headache intermittent x1 week and cough x3-4.  The patient has mild nasal congestion and nausea associated.  She has also had some intermittent hot/cold spells without any known specific fever as she has not checked her temperature.  She also reports chest discomfort with deep coughs.  This is intermittent and does not radiate. she denies sore throat, abdominal pain, or vomiting.  She has not had anything that helped her symptoms.  She denies any known sick contacts.  She is not vaccinated for COVID-19 at this time.      No past medical history on file.  There are no problems to display for this patient.   Past Surgical History:  Procedure Laterality Date  . TUBAL LIGATION      Prior to Admission medications   Medication Sig Start Date End Date Taking? Authorizing Provider  albuterol (VENTOLIN HFA) 108 (90 Base) MCG/ACT inhaler Inhale 2 puffs into the lungs every 6 (six) hours as needed for wheezing or shortness of breath. 03/09/20   Daivion Pape, Farrel Gordon, PA  fluticasone (FLONASE) 50 MCG/ACT nasal spray Place 1 spray into both nostrils 2 (two) times daily. 01/31/17   Cuthriell, Charline Bills, PA-C  loperamide (IMODIUM A-D) 2 MG tablet Take 1 tablet (2 mg total) by mouth 4 (four) times daily as needed for diarrhea or loose stools. 05/17/16   Eula Listen, MD  predniSONE (DELTASONE) 10 MG tablet Take 6 tablets (60 mg total) by mouth daily for 1 day, THEN 5 tablets (50 mg total) daily for 1 day, THEN 4 tablets (40 mg total) daily for 1 day, THEN 3 tablets (30 mg total) daily for 1 day,  THEN 2 tablets (20 mg total) daily for 1 day, THEN 1 tablet (10 mg total) daily for 1 day. 03/09/20 03/15/20  Marlana Salvage, PA  Spacer/Aero-Holding Chambers DEVI 1 Device by Does not apply route once for 1 dose. 03/09/20 03/09/20  Marlana Salvage, PA    Allergies Patient has no known allergies.  Family History  Problem Relation Age of Onset  . Cancer Mother     Social History Social History   Tobacco Use  . Smoking status: Never Smoker  . Smokeless tobacco: Never Used  Substance Use Topics  . Alcohol use: No  . Drug use: No    Review of Systems Constitutional: No fever, + chills Eyes: No visual changes. ENT: No sore throat. Cardiovascular: + Chest discomfort with coughing Respiratory: + Cough, denies shortness of breath. Gastrointestinal: No abdominal pain.  + nausea, no vomiting.  No diarrhea.  No constipation. Genitourinary: Negative for dysuria. Musculoskeletal: Negative for back pain. Skin: Negative for rash. Neurological: + for headaches, negative for focal weakness or numbness.   ____________________________________________   PHYSICAL EXAM:  VITAL SIGNS: ED Triage Vitals  Enc Vitals Group     BP 03/09/20 1044 (!) 105/55     Pulse Rate 03/09/20 1044 75     Resp 03/09/20 1044 20     Temp 03/09/20 1044 99.2 F (37.3 C)     Temp Source 03/09/20 1044 Oral     SpO2 03/09/20 1044 99 %  Weight 03/09/20 1045 230 lb (104.3 kg)     Height 03/09/20 1045 5' 7"  (1.702 m)     Head Circumference --      Peak Flow --      Pain Score 03/09/20 1406 0     Pain Loc --      Pain Edu? --      Excl. in [place]? --    Constitutional: Alert and oriented. Well appearing and in no acute distress. Eyes: Conjunctivae are normal. PERRL. EOMI. Head: Atraumatic. Nose: No congestion/rhinnorhea. Mouth/Throat: Mucous membranes are moist.  Oropharynx erythematous without tonsillar exudate. Neck: No stridor.   Cardiovascular: Normal rate, regular rhythm. Grossly normal heart  sounds.  Good peripheral circulation. Respiratory: Normal respiratory effort.  No retractions.  Expiratory wheezing appreciated in the lower lung bases.  No rhonchi or rales.  Gastrointestinal: Soft and nontender. No distention. No CVA tenderness. Musculoskeletal: No lower extremity tenderness nor edema.  No joint effusions. Neurologic:  Normal speech and language. No gross focal neurologic deficits are appreciated. No gait instability. Skin:  Skin is warm, dry and intact. No rash noted. Psychiatric: Mood and affect are normal. Speech and behavior are normal.  ____________________________________________   LABS (all labs ordered are listed, but only abnormal results are displayed)  Labs Reviewed  RESPIRATORY PANEL BY RT PCR (FLU A&B, COVID) - Abnormal; Notable for the following components:      Result Value   SARS Coronavirus 2 by RT PCR POSITIVE (*)    All other components within normal limits    ____________________________________________  RADIOLOGY  Official radiology report(s): DG Chest 2 View  Result Date: 03/09/2020 CLINICAL DATA:  Cough EXAM: CHEST - 2 VIEW COMPARISON:  09/23/2013 FINDINGS: The heart size and mediastinal contours are within normal limits. No focal airspace consolidation, pleural effusion, or pneumothorax. The visualized skeletal structures are unremarkable. IMPRESSION: No active cardiopulmonary disease. Electronically Signed   By: Davina Poke D.O.   On: 03/09/2020 13:21    ____________________________________________   INITIAL IMPRESSION / ASSESSMENT AND PLAN / ED COURSE  As part of my medical decision making, I reviewed the following data within the [place] notes reviewed and incorporated        Cathy Cooper is a 44 year old female who presents to the emergency department for evaluation of headache and cough.  The patient has not been around any known sick contacts but has also not received the Covid vaccine.   Physical exam is grossly normal except for some expiratory wheezing in the bases of the lungs in a patient with a reported history of asthma.  Chest x-ray is grossly normal with no focal pneumonia noted.  Covid and flu swab was obtained and the patient is Covid positive.  Recommended treatment with steroid taper as well as albuterol inhaler for respiratory symptoms.  The patient can also treat her fever and headache with alternating Tylenol and ibuprofen.  The patient was amenable with this plan and will follow up in a primary care clinic or return to the emergency department for any worsening.      ____________________________________________   FINAL CLINICAL IMPRESSION(S) / ED DIAGNOSES  Final diagnoses:  Viral URI with cough  Mild intermittent asthma with exacerbation  Suspected COVID-19 virus infection     ED Discharge Orders         Ordered    predniSONE (DELTASONE) 10 MG tablet        03/09/20 1351    albuterol (VENTOLIN HFA) 108 (90  Base) MCG/ACT inhaler  Every 6 hours PRN        03/09/20 1351    Spacer/Aero-Holding Chambers DEVI   Once        03/09/20 1351          *Please note:  Cathy Cooper was evaluated in Emergency Department on 03/09/2020 for the symptoms described in the history of present illness. She was evaluated in the context of the global COVID-19 pandemic, which necessitated consideration that the patient might be at risk for infection with the SARS-CoV-2 virus that causes COVID-19. Institutional protocols and algorithms that pertain to the evaluation of patients at risk for COVID-19 are in a state of rapid change based on information released by regulatory bodies including the CDC and federal and state organizations. These policies and algorithms were followed during the patient's care in the ED.  Some ED evaluations and interventions may be delayed as a result of limited staffing during and the pandemic.*   Note:  This document was prepared using Dragon  voice recognition software and may include unintentional dictation errors.    Marlana Salvage, PA 03/09/20 1652    Harvest Dark, MD 03/10/20 2006

## 2020-03-10 ENCOUNTER — Telehealth (HOSPITAL_COMMUNITY): Payer: Self-pay | Admitting: Adult Health

## 2020-03-10 NOTE — Telephone Encounter (Signed)
Called patient regarding monoclonal antibody treatment for COVID 19 given to those who are at risk for complications and/or hospitalization of the virus.  Patient meets criteria based on: BMI greater than 25 and h/o asthma. We discussed treatment and she declines therapy at this time.  She notes her symptom onset was about 3-4 days ago.  She was given our number to call back if she changes her mind, and she understands that there is a 10 day window from symptom onset to receive treatment.   Call back number given: [phone]   Wilber Bihari, NP

## 2021-03-02 ENCOUNTER — Other Ambulatory Visit: Payer: Self-pay

## 2021-03-02 ENCOUNTER — Other Ambulatory Visit (HOSPITAL_COMMUNITY)
Admission: RE | Admit: 2021-03-02 | Discharge: 2021-03-02 | Disposition: A | Discharge status: Home / Self Care | Payer: Managed Care, Other (non HMO) | Source: Ambulatory Visit | Attending: Obstetrics and Gynecology | Admitting: Obstetrics and Gynecology

## 2021-03-02 ENCOUNTER — Ambulatory Visit (INDEPENDENT_AMBULATORY_CARE_PROVIDER_SITE_OTHER): Payer: Managed Care, Other (non HMO) | Admitting: Obstetrics and Gynecology

## 2021-03-02 ENCOUNTER — Encounter: Payer: Self-pay | Admitting: Obstetrics and Gynecology

## 2021-03-02 VITALS — BP 108/77 | HR 88 | Ht 67.0 in | Wt 221.4 lb

## 2021-03-02 DIAGNOSIS — Z01419 Encounter for gynecological examination (general) (routine) without abnormal findings: Secondary | ICD-10-CM | POA: Diagnosis not present

## 2021-03-02 DIAGNOSIS — N898 Other specified noninflammatory disorders of vagina: Secondary | ICD-10-CM

## 2021-03-02 NOTE — Progress Notes (Signed)
HPI:      [name] is a 45 y.o. G5P0 who LMP was Patient's last menstrual period was 02/06/2021 (exact date).  Subjective:   She presents today for her annual examination.  She is somewhat behind in her routine care.  She has never had a mammogram.  She has a tubal ligation for birth control.  She says that she has a menstrual period every month with the exception of last month when she had 2.    Hx: The following portions of the patient's history were reviewed and updated as appropriate:             She  has no past medical history on file. She does not have a problem list on file. She  has a past surgical history that includes Tubal ligation. Her family history includes Cancer in her father and mother. She  reports that she has never smoked. She has never used smokeless tobacco. She reports current alcohol use. She reports that she does not use drugs. She has a current medication list which includes the following prescription(s): albuterol, fluticasone, and loperamide. She has No Known Allergies.       Review of Systems:  Review of Systems  Constitutional: Denied constitutional symptoms, night sweats, recent illness, fatigue, fever, insomnia and weight loss.  Eyes: Denied eye symptoms, eye pain, photophobia, vision change and visual disturbance.  Ears/Nose/Throat/Neck: Denied ear, nose, throat or neck symptoms, hearing loss, nasal discharge, sinus congestion and sore throat.  Cardiovascular: Denied cardiovascular symptoms, arrhythmia, chest pain/pressure, edema, exercise intolerance, orthopnea and palpitations.  Respiratory: Denied pulmonary symptoms, asthma, pleuritic pain, productive sputum, cough, dyspnea and wheezing.  Gastrointestinal: Denied, gastro-esophageal reflux, melena, nausea and vomiting.  Genitourinary: Denied genitourinary symptoms including symptomatic vaginal discharge, pelvic relaxation issues, and urinary complaints.  Musculoskeletal: Denied  musculoskeletal symptoms, stiffness, swelling, muscle weakness and myalgia.  Dermatologic: Denied dermatology symptoms, rash and scar.  Neurologic: Denied neurology symptoms, dizziness, headache, neck pain and syncope.  Psychiatric: Denied psychiatric symptoms, anxiety and depression.  Endocrine: Denied endocrine symptoms including hot flashes and night sweats.   Meds:   Current Outpatient Medications on File Prior to Visit  Medication Sig Dispense Refill   albuterol (VENTOLIN HFA) 108 (90 Base) MCG/ACT inhaler Inhale 2 puffs into the lungs every 6 (six) hours as needed for wheezing or shortness of breath. (Patient not taking: Reported on 03/02/2021) 8 g 2   fluticasone (FLONASE) 50 MCG/ACT nasal spray Place 1 spray into both nostrils 2 (two) times daily. (Patient not taking: Reported on 03/02/2021) 16 g 0   loperamide (IMODIUM A-D) 2 MG tablet Take 1 tablet (2 mg total) by mouth 4 (four) times daily as needed for diarrhea or loose stools. (Patient not taking: Reported on 03/02/2021) 12 tablet 0   No current facility-administered medications on file prior to visit.     Objective:     Vitals:   03/02/21 1338  BP: 108/77  Pulse: 88    Filed Weights   03/02/21 1338  Weight: 221 lb 6.4 oz (100.4 kg)              Physical examination General NAD, Conversant  HEENT Atraumatic; Op clear with mmm.  Normo-cephalic. Pupils reactive. Anicteric sclerae  Thyroid/Neck Smooth without nodularity or enlargement. Normal ROM.  Neck Supple.  Skin No rashes, lesions or ulceration. Normal palpated skin turgor. No nodularity.  Breasts: No masses or discharge.  Symmetric.  No axillary adenopathy.  Lungs: Clear to auscultation.No rales or  wheezes. Normal Respiratory effort, no retractions.  Heart: NSR.  No murmurs or rubs appreciated. No periferal edema  Abdomen: Soft.  Non-tender.  No masses.  No HSM. No hernia  Extremities: Moves all appropriately.  Normal ROM for age. No lymphadenopathy.  Neuro:  Oriented to PPT.  Normal mood. Normal affect.     Pelvic:   Vulva: Normal appearance.  No lesions.  Vagina: No lesions or abnormalities noted.  Support: Normal pelvic support.  Urethra No masses tenderness or scarring.  Meatus Normal size without lesions or prolapse.  Cervix: Normal appearance.  No lesions.  Anus: Normal exam.  No lesions.  Perineum: Normal exam.  No lesions.        Bimanual   Uterus: Top normal size non-tender.  Mobile.  AV.  Adnexae: No masses.  Non-tender to palpation.  Cul-de-sac: Negative for abnormality.     Assessment:    G5P0 There are no problems to display for this patient.    1. Well woman exam with routine gynecological exam   2. Vaginal odor        Plan:            1.  Basic Screening Recommendations The basic screening recommendations for asymptomatic women were discussed with the patient during her visit.  The age-appropriate recommendations were discussed with her and the rational for the tests reviewed.  When I am informed by the patient that another primary care physician has previously obtained the age-appropriate tests and they are up-to-date, only outstanding tests are ordered and referrals given as necessary.  Abnormal results of tests will be discussed with her when all of her results are completed.  Routine preventative health maintenance measures emphasized: Exercise/Diet/Weight control, Tobacco Warnings, Alcohol/Substance use risks and Stress Management Pap performed-blood work ordered-mammogram ordered-Cologuard ordered 2.  Nuswab for possibility of BV. 3.  Patient will inform us of her menstrual periods become irregular. Orders Orders Placed This Encounter  Procedures   MM DIGITAL SCREENING BILATERAL   Basic metabolic panel   CBC   Cologuard   Hemoglobin A1c   Lipid panel   TSH     No orders of the defined types were placed in this encounter.         F/U  Return in about 1 year (around 03/02/2022) for Annual Physical,  We will contact her with any abnormal test results.  Finis Bud, M.D. 03/02/2021 2:01 PM

## 2021-03-03 LAB — CERVICOVAGINAL ANCILLARY ONLY
Bacterial Vaginitis (gardnerella): NEGATIVE
Candida Glabrata: NEGATIVE
Candida Vaginitis: NEGATIVE
Comment: NEGATIVE
Comment: NEGATIVE
Comment: NEGATIVE

## 2021-03-03 LAB — CYTOLOGY - PAP
Adequacy: ABSENT
Comment: NEGATIVE
Diagnosis: NEGATIVE
High risk HPV: NEGATIVE

## 2021-03-06 ENCOUNTER — Other Ambulatory Visit: Payer: Managed Care, Other (non HMO)

## 2021-03-06 ENCOUNTER — Other Ambulatory Visit: Payer: Self-pay

## 2021-03-07 LAB — BASIC METABOLIC PANEL
BUN/Creatinine Ratio: 14 (ref 9–23)
BUN: 11 mg/dL (ref 6–24)
CO2: 22 mmol/L (ref 20–29)
Calcium: 8.5 mg/dL — ABNORMAL LOW (ref 8.7–10.2)
Chloride: 104 mmol/L (ref 96–106)
Creatinine, Ser: 0.77 mg/dL (ref 0.57–1.00)
Glucose: 84 mg/dL (ref 70–99)
Potassium: 4.6 mmol/L (ref 3.5–5.2)
Sodium: 139 mmol/L (ref 134–144)
eGFR: 96 mL/min/{1.73_m2} (ref >59)

## 2021-03-07 LAB — HEMOGLOBIN A1C
Est. average glucose Bld gHb Est-mCnc: 111 mg/dL
Hgb A1c MFr Bld: 5.5 % (ref 4.8–5.6)

## 2021-03-07 LAB — TSH: TSH: 2.26 u[IU]/mL (ref 0.450–4.500)

## 2021-03-07 LAB — CBC
Hematocrit: 38.8 % (ref 34.0–46.6)
Hemoglobin: 12.7 g/dL (ref 11.1–15.9)
MCH: 28.6 pg (ref 26.6–33.0)
MCHC: 32.7 g/dL (ref 31.5–35.7)
MCV: 87 fL (ref 79–97)
Platelets: 276 10*3/uL (ref 150–450)
RBC: 4.44 x10E6/uL (ref 3.77–5.28)
RDW: 12.4 % (ref 11.7–15.4)
WBC: 6.2 10*3/uL (ref 3.4–10.8)

## 2021-03-07 LAB — LIPID PANEL
Chol/HDL Ratio: 4.2 ratio (ref 0.0–4.4)
Cholesterol, Total: 155 mg/dL (ref 100–199)
HDL: 37 mg/dL — ABNORMAL LOW (ref >39)
LDL Chol Calc (NIH): 104 mg/dL — ABNORMAL HIGH (ref 0–99)
Triglycerides: 74 mg/dL (ref 0–149)
VLDL Cholesterol Cal: 14 mg/dL (ref 5–40)

## 2021-03-14 ENCOUNTER — Encounter: Payer: Self-pay | Admitting: Obstetrics and Gynecology

## 2021-03-29 LAB — COLOGUARD: Cologuard: NEGATIVE

## 2021-09-29 ENCOUNTER — Ambulatory Visit (INDEPENDENT_AMBULATORY_CARE_PROVIDER_SITE_OTHER): Payer: BC Managed Care – PPO | Admitting: Nurse Practitioner

## 2021-09-29 ENCOUNTER — Encounter: Payer: Self-pay | Admitting: Nurse Practitioner

## 2021-09-29 ENCOUNTER — Other Ambulatory Visit: Payer: Self-pay

## 2021-09-29 VITALS — BP 124/72 | HR 90 | Temp 98.0°F | Resp 16 | Ht 67.0 in | Wt 223.8 lb

## 2021-09-29 DIAGNOSIS — N941 Unspecified dyspareunia: Secondary | ICD-10-CM

## 2021-09-29 DIAGNOSIS — R1030 Lower abdominal pain, unspecified: Secondary | ICD-10-CM | POA: Diagnosis not present

## 2021-09-29 DIAGNOSIS — K219 Gastro-esophageal reflux disease without esophagitis: Secondary | ICD-10-CM | POA: Diagnosis not present

## 2021-09-29 DIAGNOSIS — R1909 Other intra-abdominal and pelvic swelling, mass and lump: Secondary | ICD-10-CM | POA: Diagnosis not present

## 2021-09-29 LAB — POCT URINALYSIS DIPSTICK
Bilirubin, UA: NEGATIVE
Blood, UA: NEGATIVE
Glucose, UA: NEGATIVE
Ketones, UA: NEGATIVE
Nitrite, UA: NEGATIVE
Protein, UA: POSITIVE — AB
Spec Grav, UA: 1.02 (ref 1.010–1.025)
Urobilinogen, UA: 0.2 E.U./dL
pH, UA: 5 (ref 5.0–8.0)

## 2021-09-29 LAB — POCT URINE PREGNANCY: Preg Test, Ur: NEGATIVE

## 2021-09-29 MED ORDER — OMEPRAZOLE 20 MG PO CPDR: 20.0000 mg | DELAYED_RELEASE_CAPSULE | Freq: Every day | ORAL | 3 refills | Status: AC

## 2021-09-29 NOTE — Progress Notes (Signed)
? ?BP 124/72   Pulse 90   Temp 98 ?F (36.7 ?C) (Oral)   Resp 16   Ht 5' 7"  (1.702 m)   Wt 223 lb 12.8 oz (101.5 kg)   LMP 08/11/2021   SpO2 98%   BMI 35.05 kg/m?   ? ?Subjective:  ? ? [name], female    DOB: [birth year], 46 y.o.   MRN: [id number] ? ?HPI: ?Cathy Cooper is a 46 y.o. female ? ?Chief Complaint  ?Patient presents with  ? Establish Care  ? Abdominal Pain  ? Cyst  ?  On left leg  ? ?Establish care: She says her last physical was last year. No medical history or medications.   ? ?Abdominal Pain: Lower quadrant abdominal pain off and on for about year.  Bristol stool scale 3-4. Slightly constipated but still having routine bowel movements.  She says sometimes she has to force her bowel movement. discussed increasing fiber intake and water intake, recommended taking a stool softener. she denies any nausea, vomiting or fever.  She is not currently having the pain .  She denies any urinary frequency, urgency or dysuria.  she says that the pain is worse with intercourse. She says that her period has been changing. She says that it is irregular but she has had months where she is spotting all month long.  Abdominal exam is benign. will get labs, urine, and upreg. Discussed will need to send her to GYN for painful intercourse. ? ?Cyst/lump: She has a large lump on the left inner thigh.  She says she noticed it about a year ago and has continued to grow.  She says it is tender to the touch there is no redness or heat.  Denies any drainage.  Will place referral for general surgery for further evaluation.  ? ?GERD: She says she has really bad acid reflux.  She says she has been dealing with this for a while now and that it does wake her up in the middle the night . she says she has recently started using omeprazole over-the-counter, she says it has helped.  Discussed avoiding food triggers and not to lay down a hour after eating. ? ?Relevant past medical, surgical, family and social  history reviewed and updated as indicated. Interim medical history since our last visit reviewed. ?Allergies and medications reviewed and updated. ? ?Review of Systems ? ?Constitutional: Negative for fever or weight change.  ?Respiratory: Negative for cough and shortness of breath.   ?Cardiovascular: Negative for chest pain or palpitations.  ?Gastrointestinal: Positive for abdominal pain, some constipation ?Musculoskeletal: Negative for gait problem or joint swelling.  ?Skin: Negative for rash.  Positive for lump on left upper thigh ?Neurological: Negative for dizziness or headache.  ?No other specific complaints in a complete review of systems (except as listed in HPI above).  ? ?   ?Objective:  ?  ?BP 124/72   Pulse 90   Temp 98 ?F (36.7 ?C) (Oral)   Resp 16   Ht 5' 7"  (1.702 m)   Wt 223 lb 12.8 oz (101.5 kg)   LMP 08/11/2021   SpO2 98%   BMI 35.05 kg/m?   ?Wt Readings from Last 3 Encounters:  ?09/29/21 223 lb 12.8 oz (101.5 kg)  ?03/02/21 221 lb 6.4 oz (100.4 kg)  ?03/09/20 230 lb (104.3 kg)  ?  ?Physical Exam ? ?Constitutional: Patient appears well-developed and well-nourished. Obese  No distress.  ?HEENT: head atraumatic, normocephalic, pupils equal and reactive  to light, neck supple ?Cardiovascular: Normal rate, regular rhythm and normal heart sounds.  No murmur heard. No BLE edema. ?Pulmonary/Chest: Effort normal and breath sounds normal. No respiratory distress. ?Abdominal: Soft.  There is no tenderness. ?Psychiatric: Patient has a normal mood and affect. behavior is normal. Judgment and thought content normal.  ? ?Results for orders placed or performed in visit on 09/29/21  ?POCT urinalysis dipstick  ?Result Value Ref Range  ? Color, UA yellow   ? Clarity, UA clear   ? Glucose, UA Negative Negative  ? Bilirubin, UA negative   ? Ketones, UA negative   ? Spec Grav, UA 1.020 1.010 - 1.025  ? Blood, UA negative   ? pH, UA 5.0 5.0 - 8.0  ? Protein, UA Positive (A) Negative  ? Urobilinogen, UA 0.2 0.2 or  1.0 E.U./dL  ? Nitrite, UA negative   ? Leukocytes, UA Small (1+) (A) Negative  ? Appearance clear   ? Odor none   ?POCT urine pregnancy  ?Result Value Ref Range  ? Preg Test, Ur Negative Negative  ? ?   ?Assessment & Plan:  ? ?1. Lower abdominal pain ?Increase fiber and water intake ?Take over-the-counter stool softener ?- Ambulatory referral to Gynecology ?- Urine Culture ?- POCT urinalysis dipstick ?- POCT urine pregnancy ?- CBC with Differential/Platelet ?- COMPLETE METABOLIC PANEL WITH GFR ? ?2. Dyspareunia, female ?- POCT urinalysis dipstick ?- POCT urine pregnancy ?- CBC with Differential/Platelet ?- COMPLETE METABOLIC PANEL WITH GFR ?- Ambulatory referral to Gynecology ?- Urine Culture ? ?3. Lump in left thigh ? ?- Ambulatory referral to General Surgery ? ?4. Gastroesophageal reflux disease without esophagitis ?Avoid food triggers ?Do not lay down 1 hour after eating ?- omeprazole (PRILOSEC) 20 MG capsule; Take 1 capsule (20 mg total) by mouth daily.  Dispense: 30 capsule; Refill: 3  ? ?Follow up plan: ?Return in about 6 months (around 03/31/2022) for cpe. ? ? ? ? ? ?

## 2021-09-30 LAB — CBC WITH DIFFERENTIAL/PLATELET
Absolute Monocytes: 646 cells/uL (ref 200–950)
Basophils Absolute: 41 cells/uL (ref 0–200)
Basophils Relative: 0.6 %
Eosinophils Absolute: 143 cells/uL (ref 15–500)
Eosinophils Relative: 2.1 %
HCT: 40.1 % (ref 35.0–45.0)
Hemoglobin: 13.3 g/dL (ref 11.7–15.5)
Lymphs Abs: 1863 cells/uL (ref 850–3900)
MCH: 28.9 pg (ref 27.0–33.0)
MCHC: 33.2 g/dL (ref 32.0–36.0)
MCV: 87.2 fL (ref 80.0–100.0)
MPV: 11.8 fL (ref 7.5–12.5)
Monocytes Relative: 9.5 %
Neutro Abs: 4107 cells/uL (ref 1500–7800)
Neutrophils Relative %: 60.4 %
Platelets: 250 10*3/uL (ref 140–400)
RBC: 4.6 10*6/uL (ref 3.80–5.10)
RDW: 13.4 % (ref 11.0–15.0)
Total Lymphocyte: 27.4 %
WBC: 6.8 10*3/uL (ref 3.8–10.8)

## 2021-09-30 LAB — URINE CULTURE
MICRO NUMBER:: 13296941
Result:: NO GROWTH
SPECIMEN QUALITY:: ADEQUATE

## 2021-09-30 LAB — COMPLETE METABOLIC PANEL WITH GFR
AG Ratio: 1.4 (calc) (ref 1.0–2.5)
ALT: 17 U/L (ref 6–29)
AST: 16 U/L (ref 10–35)
Albumin: 3.8 g/dL (ref 3.6–5.1)
Alkaline phosphatase (APISO): 57 U/L (ref 31–125)
BUN: 12 mg/dL (ref 7–25)
CO2: 27 mmol/L (ref 20–32)
Calcium: 8.8 mg/dL (ref 8.6–10.2)
Chloride: 105 mmol/L (ref 98–110)
Creat: 0.85 mg/dL (ref 0.50–0.99)
Globulin: 2.8 g/dL (calc) (ref 1.9–3.7)
Glucose, Bld: 80 mg/dL (ref 65–99)
Potassium: 4.8 mmol/L (ref 3.5–5.3)
Sodium: 137 mmol/L (ref 135–146)
Total Bilirubin: 0.9 mg/dL (ref 0.2–1.2)
Total Protein: 6.6 g/dL (ref 6.1–8.1)
eGFR: 86 mL/min/{1.73_m2} (ref >=60)

## 2021-10-03 ENCOUNTER — Other Ambulatory Visit: Payer: Self-pay

## 2021-10-03 ENCOUNTER — Telehealth: Payer: Self-pay

## 2021-10-03 ENCOUNTER — Ambulatory Visit: Payer: BC Managed Care – PPO | Admitting: Surgery

## 2021-10-03 ENCOUNTER — Encounter: Payer: Self-pay | Admitting: Surgery

## 2021-10-03 ENCOUNTER — Ambulatory Visit: Payer: Self-pay | Admitting: Surgery

## 2021-10-03 VITALS — BP 123/71 | HR 75 | Temp 98.1°F | Ht 67.0 in | Wt 224.6 lb

## 2021-10-03 DIAGNOSIS — M7989 Other specified soft tissue disorders: Secondary | ICD-10-CM

## 2021-10-03 DIAGNOSIS — R2242 Localized swelling, mass and lump, left lower limb: Secondary | ICD-10-CM

## 2021-10-03 NOTE — H&P (View-Only) (Signed)
[name], female   DOB: [birth year], 46 y.o.   MRN: [id number] ? ?Chief Complaint: Left medial thigh mass/cyst ? ?History of Present Illness ?Cathy Cooper is a 45 y.o. female with the above lesion, present for 5 years, increasing in size especially over the last year.  She denies any history of drainage, reports it becoming progressively tender and more sensitive.  She denies any fevers, drainage or other similar lesions elsewhere.. ? ?Past Medical History ?History reviewed. No pertinent past medical history.  ? ? ?Past Surgical History:  ?Procedure Laterality Date  ? TUBAL LIGATION    ? ? ?No Known Allergies ? ?Current Outpatient Medications  ?Medication Sig Dispense Refill  ? omeprazole (PRILOSEC) 20 MG capsule Take 1 capsule (20 mg total) by mouth daily. 30 capsule 3  ? ?No current facility-administered medications for this visit.  ? ? ?Family History ?Family History  ?Problem Relation Age of Onset  ? Cancer Mother   ? Cancer Father   ?  ? ? ?Social History ?Social History  ? ?Tobacco Use  ? Smoking status: Never  ? Smokeless tobacco: Never  ?Vaping Use  ? Vaping Use: Never used  ?Substance Use Topics  ? Alcohol use: Yes  ?  Comment: occass  ? Drug use: No  ?  ? ?Review of Systems  ?Constitutional: Negative.   ?HENT: Negative.    ?Eyes: Negative.   ?Respiratory: Negative.    ?Cardiovascular: Negative.   ?Gastrointestinal: Negative.   ?Genitourinary: Negative.   ?Skin: Negative.   ?Neurological: Negative.   ?Psychiatric/Behavioral: Negative.    ? ? ?Physical Exam ?Blood pressure 123/71, pulse 75, temperature 98.1 ?F (36.7 ?C), temperature source Oral, height 5' 7"  (1.702 m), weight 224 lb 9.6 oz (101.9 kg), SpO2 93 %. ?Last Weight  Most recent update: 10/03/2021  3:00 PM  ? ? Weight  ?101.9 kg (224 lb 9.6 oz)  ?      ? ?  ? ? ?CONSTITUTIONAL: Well developed, and nourished, appropriately responsive and aware without distress.   ?EYES: Sclera non-icteric.   ?EARS, NOSE, MOUTH AND THROAT:  The  oropharynx is clear. Oral mucosa is pink and moist.    Hearing is intact to voice.  ?NECK: Trachea is midline, and there is no jugular venous distension.  ?LYMPH NODES:  Lymph nodes in the neck are not enlarged. ?RESPIRATORY:  Lungs are clear, and breath sounds are equal bilaterally. Normal respiratory effort without pathologic use of accessory muscles. ?CARDIOVASCULAR: Heart is regular in rate and rhythm. ?GI: The abdomen is soft, nontender, and nondistended. There were no palpable masses. I did not appreciate hepatosplenomegaly. There were normal bowel sounds. ?MUSCULOSKELETAL:  Symmetrical muscle tone appreciated in all four extremities.    ?SKIN: Skin turgor is normal. No pathologic skin lesions appreciated.  ?There is a raised soft tissue mass approximately 8 to 9 cm in diameter on the medial upper left thigh.  There is no overlying skin changes or punctum to suggest this being a cyst.  I suspect this is a mature lipoma. ?NEUROLOGIC:  Motor and sensation appear grossly normal.  Cranial nerves are grossly without defect. ?PSYCH:  Alert and oriented to person, place and time. Affect is appropriate for situation. ? ?Data Reviewed ?I have personally reviewed what is currently available of the patient's imaging, recent labs and medical records.   ?Labs:  ? ?  Latest Ref Rng & Units 09/29/2021  ? 11:42 AM 03/06/2021  ?  8:11 AM 05/17/2016  ?  7:21 PM  ?CBC  ?WBC 3.8 - 10.8 Thousand/uL 6.8   6.2   8.2    ?Hemoglobin 11.7 - 15.5 g/dL 13.3   12.7   14.0    ?Hematocrit 35.0 - 45.0 % 40.1   38.8   40.6    ?Platelets 140 - 400 Thousand/uL 250   276   228    ? ? ?  Latest Ref Rng & Units 09/29/2021  ? 11:42 AM 03/06/2021  ?  8:11 AM 05/17/2016  ?  7:21 PM  ?CMP  ?Glucose 65 - 99 mg/dL 80   84   122    ?BUN 7 - 25 mg/dL 12   11   15     ?Creatinine 0.50 - 0.99 mg/dL 0.85   0.77   0.61    ?Sodium 135 - 146 mmol/L 137   139   136    ?Potassium 3.5 - 5.3 mmol/L 4.8   4.6   3.4    ?Chloride 98 - 110 mmol/L 105   104   107    ?CO2 20 -  32 mmol/L 27   22   24     ?Calcium 8.6 - 10.2 mg/dL 8.8   8.5   8.3    ?Total Protein 6.1 - 8.1 g/dL 6.6      ?Total Bilirubin 0.2 - 1.2 mg/dL 0.9      ?AST 10 - 35 U/L 16      ?ALT 6 - 29 U/L 17      ? ? ? ? ?Imaging: ? ?Within last 24 hrs: No results found. ? ?Assessment ?   ?Soft tissue mass left proximal medial thigh. ?There are no problems to display for this patient. ? ? ?Plan ?   ?Excision of soft tissue mass left proximal medial thigh. ?Suspected mature lipoma, other etiologies possible.  Risks of anesthesia, bleeding, infection, recurrence, scar etc. discussed with patient.  I believe she understands and desires to proceed.  Questions answered, no guarantees ever expressed or implied. ? ?Face-to-face time spent with the patient and accompanying care providers(if present) was 23 minutes, with more than 50% of the time spent counseling, educating, and coordinating care of the patient.   ? ?These notes generated with voice recognition software. I apologize for typographical errors. ? ?Ronny Bacon M.D., FACS ?10/03/2021, 3:17 PM ? ? ? ? ?

## 2021-10-03 NOTE — Telephone Encounter (Signed)
Pt is scheduled on Wednesday, May 24th at 2:15 with LMD. ?

## 2021-10-03 NOTE — Progress Notes (Signed)
[name], female   DOB: 07/30/1975, 46 y.o.   MRN: [id number] ? ?Chief Complaint: Left medial thigh mass/cyst ? ?History of Present Illness ?Cathy Cooper is a 46 y.o. female with the above lesion, present for 5 years, increasing in size especially over the last year.  She denies any history of drainage, reports it becoming progressively tender and more sensitive.  She denies any fevers, drainage or other similar lesions elsewhere.. ? ?Past Medical History ?History reviewed. No pertinent past medical history.  ? ? ?Past Surgical History:  ?Procedure Laterality Date  ? TUBAL LIGATION    ? ? ?No Known Allergies ? ?Current Outpatient Medications  ?Medication Sig Dispense Refill  ? omeprazole (PRILOSEC) 20 MG capsule Take 1 capsule (20 mg total) by mouth daily. 30 capsule 3  ? ?No current facility-administered medications for this visit.  ? ? ?Family History ?Family History  ?Problem Relation Age of Onset  ? Cancer Mother   ? Cancer Father   ?  ? ? ?Social History ?Social History  ? ?Tobacco Use  ? Smoking status: Never  ? Smokeless tobacco: Never  ?Vaping Use  ? Vaping Use: Never used  ?Substance Use Topics  ? Alcohol use: Yes  ?  Comment: occass  ? Drug use: No  ?  ? ?Review of Systems  ?Constitutional: Negative.   ?HENT: Negative.    ?Eyes: Negative.   ?Respiratory: Negative.    ?Cardiovascular: Negative.   ?Gastrointestinal: Negative.   ?Genitourinary: Negative.   ?Skin: Negative.   ?Neurological: Negative.   ?Psychiatric/Behavioral: Negative.    ? ? ?Physical Exam ?Blood pressure 123/71, pulse 75, temperature 98.1 ?F (36.7 ?C), temperature source Oral, height 5' 7"  (1.702 m), weight 224 lb 9.6 oz (101.9 kg), SpO2 93 %. ?Last Weight  Most recent update: 10/03/2021  3:00 PM  ? ? Weight  ?101.9 kg (224 lb 9.6 oz)  ?      ? ?  ? ? ?CONSTITUTIONAL: Well developed, and nourished, appropriately responsive and aware without distress.   ?EYES: Sclera non-icteric.   ?EARS, NOSE, MOUTH AND THROAT:  The  oropharynx is clear. Oral mucosa is pink and moist.    Hearing is intact to voice.  ?NECK: Trachea is midline, and there is no jugular venous distension.  ?LYMPH NODES:  Lymph nodes in the neck are not enlarged. ?RESPIRATORY:  Lungs are clear, and breath sounds are equal bilaterally. Normal respiratory effort without pathologic use of accessory muscles. ?CARDIOVASCULAR: Heart is regular in rate and rhythm. ?GI: The abdomen is soft, nontender, and nondistended. There were no palpable masses. I did not appreciate hepatosplenomegaly. There were normal bowel sounds. ?MUSCULOSKELETAL:  Symmetrical muscle tone appreciated in all four extremities.    ?SKIN: Skin turgor is normal. No pathologic skin lesions appreciated.  ?There is a raised soft tissue mass approximately 8 to 9 cm in diameter on the medial upper left thigh.  There is no overlying skin changes or punctum to suggest this being a cyst.  I suspect this is a mature lipoma. ?NEUROLOGIC:  Motor and sensation appear grossly normal.  Cranial nerves are grossly without defect. ?PSYCH:  Alert and oriented to person, place and time. Affect is appropriate for situation. ? ?Data Reviewed ?I have personally reviewed what is currently available of the patient's imaging, recent labs and medical records.   ?Labs:  ? ?  Latest Ref Rng & Units 09/29/2021  ? 11:42 AM 03/06/2021  ?  8:11 AM 05/17/2016  ?  7:21 PM  ?CBC  ?WBC 3.8 - 10.8 Thousand/uL 6.8   6.2   8.2    ?Hemoglobin 11.7 - 15.5 g/dL 13.3   12.7   14.0    ?Hematocrit 35.0 - 45.0 % 40.1   38.8   40.6    ?Platelets 140 - 400 Thousand/uL 250   276   228    ? ? ?  Latest Ref Rng & Units 09/29/2021  ? 11:42 AM 03/06/2021  ?  8:11 AM 05/17/2016  ?  7:21 PM  ?CMP  ?Glucose 65 - 99 mg/dL 80   84   122    ?BUN 7 - 25 mg/dL 12   11   15     ?Creatinine 0.50 - 0.99 mg/dL 0.85   0.77   0.61    ?Sodium 135 - 146 mmol/L 137   139   136    ?Potassium 3.5 - 5.3 mmol/L 4.8   4.6   3.4    ?Chloride 98 - 110 mmol/L 105   104   107    ?CO2 20 -  32 mmol/L 27   22   24     ?Calcium 8.6 - 10.2 mg/dL 8.8   8.5   8.3    ?Total Protein 6.1 - 8.1 g/dL 6.6      ?Total Bilirubin 0.2 - 1.2 mg/dL 0.9      ?AST 10 - 35 U/L 16      ?ALT 6 - 29 U/L 17      ? ? ? ? ?Imaging: ? ?Within last 24 hrs: No results found. ? ?Assessment ?   ?Soft tissue mass left proximal medial thigh. ?There are no problems to display for this patient. ? ? ?Plan ?   ?Excision of soft tissue mass left proximal medial thigh. ?Suspected mature lipoma, other etiologies possible.  Risks of anesthesia, bleeding, infection, recurrence, scar etc. discussed with patient.  I believe she understands and desires to proceed.  Questions answered, no guarantees ever expressed or implied. ? ?Face-to-face time spent with the patient and accompanying care providers(if present) was 23 minutes, with more than 50% of the time spent counseling, educating, and coordinating care of the patient.   ? ?These notes generated with voice recognition software. I apologize for typographical errors. ? ?Ronny Bacon M.D., FACS ?10/03/2021, 3:17 PM ? ? ? ? ?

## 2021-10-03 NOTE — Telephone Encounter (Signed)
Rassette, Myrtie Soman, RN  Tanner Yeley, Clarise Cruz Schedule with any APP for Dyspareunia, changes in menstration 3-4 weeks or sooner if you have something.        [facility] referring for Dyspareunia, changes in menstration 3-4 weeks or sooner if you have something. Sch w CNM. Called and left voicemail for patient to call back to be scheduled.

## 2021-10-03 NOTE — Patient Instructions (Signed)
Our surgery scheduler will call you within 24-48 hours to schedule your surgery. Please have the [facility] surgery sheet available when speaking with her. ? ? ?Lipoma Removal ? ?Lipoma removal is a surgical procedure to remove a lipoma, which is a noncancerous (benign) tumor that is made up of fat cells. Most lipomas are small and painless and do not require treatment. They can form in many areas of the body but are most common under the skin of the back, arms, shoulders, buttocks, and thighs. ?You may need lipoma removal if you have a lipoma that is large, growing, or causing discomfort. Lipoma removal may also be done for cosmetic reasons. ?Tell a health care provider about: ?Any allergies you have. ?All medicines you are taking, including vitamins, herbs, eye drops, creams, and over-the-counter medicines. ?Any problems you or family members have had with anesthetic medicines. ?Any bleeding problems you have. ?Any surgeries you have had. ?Any medical conditions you have. ?Whether you are pregnant or may be pregnant. ?What are the risks? ?Generally, this is a safe procedure. However, problems may occur, including: ?Infection. ?Bleeding. ?Scarring. ?Allergic reactions to medicines. ?Damage to nearby structures or organs, such as damage to nerves or blood vessels near the lipoma. ?What happens before the procedure? ?When to Stop Eating and Drinking ?Follow instructions from your health care provider about what you may eat and drink before your procedure. These may include: ?8 hours before your procedure ?Stop eating most foods. Do not eat meat, fried foods, or fatty foods. ?Eat only light foods, such as toast or crackers. ?All liquids are okay except energy drinks and alcohol. ?6 hours before your procedure ?Stop eating. ?Drink only clear liquids, such as water, clear fruit juice, black coffee, plain tea, and sports drinks. ?Do not drink energy drinks or alcohol. ?2 hours before your procedure ?Stop drinking all  liquids. ?You may be allowed to take medicines with small sips of water. ?If you do not follow your health care provider's instructions, your procedure may be delayed or canceled. ?Medicines ?Ask your health care provider about: ?Changing or stopping your regular medicines. This is especially important if you are taking diabetes medicines or blood thinners. ?Taking medicines such as aspirin and ibuprofen. These medicines can thin your blood. Do not take these medicines unless your health care provider tells you to take them. ?Taking over-the-counter medicines, vitamins, herbs, and supplements. ?General instructions ?You will have a physical exam. Your health care provider will check the size of the lipoma and whether it can be removed easily. ?You may have a biopsy and imaging tests, such as X-rays, a CT scan, and an MRI. ?Do not use any products that contain nicotine or tobacco for at least 4 weeks before the procedure. These products include cigarettes, chewing tobacco, and vaping devices, such as e-cigarettes. If you need help quitting, ask your health care provider. ?Ask your health care provider: ?How your surgery site will be marked. ?What steps will be taken to help prevent infection. These may include: ?Washing skin with a germ-killing soap. ?Taking antibiotic medicine. ?If you will be going home right after the procedure, plan to have a responsible adult: ?Take you home from the hospital or clinic. You will not be allowed to drive. ?Care for you for the time you are told. ?What happens during the procedure? ? ?An IV will be inserted into one of your veins. ?You will be given one or more of the following: ?A medicine to help you relax (sedative). ?A medicine to  numb the area (local anesthetic). ?A medicine to make you fall asleep (general anesthetic). ?A medicine that is injected into an area of your body to numb everything below the injection site (regional anesthetic). ?An incision will be made into the  skin over the lipoma or very near the lipoma. The incision may be made in a natural skin line or crease. ?Tissues, nerves, and blood vessels near the lipoma will be moved out of the way. ?The lipoma and the capsule that surrounds it will be separated from the surrounding tissues. ?The lipoma will be removed. ?The incision may be closed with stitches (sutures). ?A bandage (dressing) will be placed over the incision. ?The procedure may vary among health care providers and hospitals. ?What happens after the procedure? ?Your blood pressure, heart rate, breathing rate, and blood oxygen level will be monitored until you leave the hospital or clinic. ?If you were prescribed an antibiotic medicine, use it as told by your health care provider. Do not stop using the antibiotic even if you start to feel better. ?If you were given a sedative during the procedure, it can affect you for several hours. Do not drive or operate machinery until your health care provider says that it is safe. ?Where to find more information ?OrthoInfo: orthoinfo.aaos.org ?Summary ?Before the procedure, follow instructions from your health care provider about eating and drinking, and changing or stopping your regular medicines. This is especially important if you are taking diabetes medicines or blood thinners. ?After the lipoma is removed, the incision may be closed with stitches (sutures) and covered with a bandage (dressing). ?If you were given a sedative during the procedure, it can affect you for several hours. Do not drive or operate machinery until your health care provider says that it is safe. ?This information is not intended to replace advice given to you by your health care provider. Make sure you discuss any questions you have with your health care provider. ?Document Revised: 06/16/2021 Document Reviewed: 06/16/2021 ?Elsevier Patient Education ? [place]. ? ?

## 2021-10-04 ENCOUNTER — Telehealth: Payer: Self-pay | Admitting: Surgery

## 2021-10-04 NOTE — Telephone Encounter (Signed)
Patient has been advised of Pre-Admission date/time, COVID Testing date and Surgery date. ? ?Surgery Date: 10/11/21 ?Preadmission Testing Date: 10/09/21 (phone 8a-1p) ?Covid Testing Date: Not needed.    ? ?Patient has been made aware to call [phone], between 1-3:00pm the day before surgery, to find out what time to arrive for surgery.   ? ?

## 2021-10-09 ENCOUNTER — Other Ambulatory Visit: Payer: Self-pay

## 2021-10-09 ENCOUNTER — Other Ambulatory Visit
Admission: RE | Admit: 2021-10-09 | Discharge: 2021-10-09 | Disposition: A | Discharge status: Home / Self Care | Payer: BC Managed Care – PPO | Source: Ambulatory Visit | Attending: Surgery | Admitting: Surgery

## 2021-10-09 HISTORY — DX: Headache, unspecified: R51.9

## 2021-10-09 HISTORY — DX: Pneumonia, unspecified organism: J18.9

## 2021-10-09 HISTORY — DX: Anemia, unspecified: D64.9

## 2021-10-09 NOTE — Patient Instructions (Addendum)
Your procedure is scheduled on: 10/11/21 - Wednesday ?Report to the Registration Desk on the 1st floor of the [facility]. ?To find out your arrival time, please call [phone] between 1PM - 3PM on: 10/10/21 - Tuesday ?If your arrival time is 6:00 am, do not arrive prior to that time as the [facility] entrance doors do not open until 6:00 am. ? ?REMEMBER: ?Instructions that are not followed completely may result in serious medical risk, up to and including death; or upon the discretion of your surgeon and anesthesiologist your surgery may need to be rescheduled. ? ?Do not eat food after midnight the night before surgery.  ?No gum chewing, lozengers or hard candies. ? ?You may however, drink CLEAR liquids up to 2 hours before you are scheduled to arrive for your surgery. Do not drink anything within 2 hours of your scheduled arrival time. ? ?Clear liquids include: ?- water  ?- apple juice without pulp ?- gatorade (not RED colors) ?- black coffee or tea (Do NOT add milk or creamers to the coffee or tea) ?Do NOT drink anything that is not on this list. ? ?TAKE THESE MEDICATIONS THE MORNING OF SURGERY WITH A SIP OF WATER: ? ?- omeprazole (PRILOSEC) 20 MG capsule, (take one the night before and one on the morning of surgery - helps to prevent nausea after surgery.) ? ?One week prior to surgery: ?Stop Anti-inflammatories (NSAIDS) such as Advil, Aleve, Ibuprofen, Motrin, Naproxen, Naprosyn and Aspirin based products such as Excedrin, Goodys Powder, BC Powder. ? ?Stop ANY OVER THE COUNTER supplements until after surgery. ? ?You may however, continue to take Tylenol if needed for pain up until the day of surgery. ? ?No Alcohol for 24 hours before or after surgery. ? ?No Smoking including e-cigarettes for 24 hours prior to surgery.  ?No chewable tobacco products for at least 6 hours prior to surgery.  ?No nicotine patches on the day of surgery. ? ?Do not use any "recreational" drugs for at least a week prior to your  surgery.  ?Please be advised that the combination of cocaine and anesthesia may have negative outcomes, up to and including death. ?If you test positive for cocaine, your surgery will be cancelled. ? ?On the morning of surgery brush your teeth with toothpaste and water, you may rinse your mouth with mouthwash if you wish. ?Do not swallow any toothpaste or mouthwash. ? ?Do not wear jewelry, make-up, hairpins, clips or nail polish. ? ?Do not wear lotions, powders, or perfumes.  ? ?Do not shave body from the neck down 48 hours prior to surgery just in case you cut yourself which could leave a site for infection.  ?Also, freshly shaved skin may become irritated if using the CHG soap. ? ?Contact lenses, hearing aids and dentures may not be worn into surgery. ? ?Do not bring valuables to the hospital. [facility] is not responsible for any missing/lost belongings or valuables.  ? ?Notify your doctor if there is any change in your medical condition (cold, fever, infection). ? ?Wear comfortable clothing (specific to your surgery type) to the hospital. ? ?After surgery, you can help prevent lung complications by doing breathing exercises.  ?Take deep breaths and cough every 1-2 hours. Your doctor may order a device called an Incentive Spirometer to help you take deep breaths. ?When coughing or sneezing, hold a pillow firmly against your incision with both hands. This is called ?splinting.? Doing this helps protect your incision. It also decreases belly discomfort. ? ?If  you are being admitted to the hospital overnight, leave your suitcase in the car. ?After surgery it may be brought to your room. ? ?If you are being discharged the day of surgery, you will not be allowed to drive home. ?You will need a responsible adult (18 years or older) to drive you home and stay with you that night.  ? ?If you are taking public transportation, you will need to have a responsible adult (18 years or older) with you. ?Please confirm with  your physician that it is acceptable to use public transportation.  ? ?Please call the [facility] at [phone] if you have any questions about these instructions. ? ?Surgery Visitation Policy: ? ?Patients undergoing a surgery or procedure may have two family members or support persons with them as long as the person is not COVID-19 positive or experiencing its symptoms.  ? ?Inpatient Visitation:   ? ?Visiting hours are 7 a.m. to 8 p.m. ?Up to four visitors are allowed at one time in a patient room, including children. The visitors may rotate out with other people during the day. One designated support person (adult) may remain overnight.  ?

## 2021-10-11 ENCOUNTER — Ambulatory Visit: Payer: BC Managed Care – PPO | Admitting: Anesthesiology

## 2021-10-11 ENCOUNTER — Encounter
Admission: RE | Disposition: A | Discharge status: Home / Self Care | Payer: Self-pay | Source: Ambulatory Visit | Attending: Surgery

## 2021-10-11 ENCOUNTER — Ambulatory Visit
Admission: RE | Admit: 2021-10-11 | Discharge: 2021-10-11 | Disposition: A | Discharge status: Home / Self Care | Payer: BC Managed Care – PPO | Source: Ambulatory Visit | Attending: Surgery | Admitting: Surgery

## 2021-10-11 ENCOUNTER — Other Ambulatory Visit: Payer: Self-pay

## 2021-10-11 ENCOUNTER — Encounter: Payer: Self-pay | Admitting: Surgery

## 2021-10-11 DIAGNOSIS — R2242 Localized swelling, mass and lump, left lower limb: Secondary | ICD-10-CM | POA: Diagnosis present

## 2021-10-11 DIAGNOSIS — D1724 Benign lipomatous neoplasm of skin and subcutaneous tissue of left leg: Secondary | ICD-10-CM | POA: Diagnosis not present

## 2021-10-11 DIAGNOSIS — K219 Gastro-esophageal reflux disease without esophagitis: Secondary | ICD-10-CM | POA: Diagnosis not present

## 2021-10-11 DIAGNOSIS — Z79899 Other long term (current) drug therapy: Secondary | ICD-10-CM | POA: Diagnosis not present

## 2021-10-11 DIAGNOSIS — M7989 Other specified soft tissue disorders: Secondary | ICD-10-CM

## 2021-10-11 HISTORY — PX: MASS EXCISION: SHX2000

## 2021-10-11 LAB — POCT PREGNANCY, URINE: Preg Test, Ur: NEGATIVE

## 2021-10-11 SURGERY — EXCISION MASS
Anesthesia: General | Site: Leg Upper | Laterality: Left

## 2021-10-11 MED ORDER — CHLORHEXIDINE GLUCONATE CLOTH 2 % EX PADS
6.0000 | MEDICATED_PAD | Freq: Once | CUTANEOUS | Status: AC
  Administered 2021-10-11: 6 via TOPICAL

## 2021-10-11 MED ORDER — OXYCODONE HCL 5 MG/5ML PO SOLN: 5.0000 mg | Freq: Once | ORAL | Status: DC | PRN

## 2021-10-11 MED ORDER — MIDAZOLAM HCL 2 MG/2ML IJ SOLN
INTRAMUSCULAR | Status: DC | PRN
Start: 2021-10-11 — End: 2021-10-11
  Administered 2021-10-11: 2 mg via INTRAVENOUS

## 2021-10-11 MED ORDER — BUPIVACAINE-EPINEPHRINE (PF) 0.25% -1:200000 IJ SOLN
INTRAMUSCULAR | Status: DC | PRN
  Administered 2021-10-11: 10 mL
  Administered 2021-10-11: 7 mL

## 2021-10-11 MED ORDER — GABAPENTIN 300 MG PO CAPS
ORAL_CAPSULE | ORAL | Status: AC
  Administered 2021-10-11: 300 mg via ORAL
  Filled 2021-10-11: qty 1

## 2021-10-11 MED ORDER — CELECOXIB 200 MG PO CAPS
ORAL_CAPSULE | ORAL | Status: AC
  Administered 2021-10-11: 200 mg via ORAL
  Filled 2021-10-11: qty 1

## 2021-10-11 MED ORDER — DROPERIDOL 2.5 MG/ML IJ SOLN: 0.6250 mg | Freq: Once | INTRAMUSCULAR | Status: DC | PRN

## 2021-10-11 MED ORDER — CELECOXIB 200 MG PO CAPS: 200.0000 mg | ORAL_CAPSULE | ORAL | Status: AC

## 2021-10-11 MED ORDER — CHLORHEXIDINE GLUCONATE CLOTH 2 % EX PADS: 6.0000 | MEDICATED_PAD | Freq: Once | CUTANEOUS | Status: DC

## 2021-10-11 MED ORDER — FENTANYL CITRATE (PF) 100 MCG/2ML IJ SOLN
INTRAMUSCULAR | Status: AC
  Filled 2021-10-11: qty 2

## 2021-10-11 MED ORDER — PROPOFOL 10 MG/ML IV BOLUS
INTRAVENOUS | Status: AC
  Filled 2021-10-11: qty 20

## 2021-10-11 MED ORDER — CHLORHEXIDINE GLUCONATE 0.12 % MT SOLN: 15.0000 mL | Freq: Once | OROMUCOSAL | Status: AC

## 2021-10-11 MED ORDER — PROPOFOL 10 MG/ML IV BOLUS
INTRAVENOUS | Status: DC | PRN
  Administered 2021-10-11: 160 mg via INTRAVENOUS

## 2021-10-11 MED ORDER — EPHEDRINE 5 MG/ML INJ
INTRAVENOUS | Status: AC
  Filled 2021-10-11: qty 5

## 2021-10-11 MED ORDER — LACTATED RINGERS IV SOLN: INTRAVENOUS | Status: DC

## 2021-10-11 MED ORDER — CEFAZOLIN SODIUM-DEXTROSE 2-4 GM/100ML-% IV SOLN
INTRAVENOUS | Status: AC
  Filled 2021-10-11: qty 100

## 2021-10-11 MED ORDER — CEFAZOLIN SODIUM-DEXTROSE 2-4 GM/100ML-% IV SOLN
2.0000 g | INTRAVENOUS | Status: AC
  Administered 2021-10-11: 2 g via INTRAVENOUS

## 2021-10-11 MED ORDER — BUPIVACAINE-EPINEPHRINE (PF) 0.25% -1:200000 IJ SOLN
INTRAMUSCULAR | Status: AC
Start: 2021-10-11 — End: ?
  Filled 2021-10-11: qty 30

## 2021-10-11 MED ORDER — FENTANYL CITRATE (PF) 100 MCG/2ML IJ SOLN
INTRAMUSCULAR | Status: DC | PRN
  Administered 2021-10-11 (×2): 50 ug via INTRAVENOUS

## 2021-10-11 MED ORDER — OXYCODONE HCL 5 MG PO TABS: 5.0000 mg | ORAL_TABLET | Freq: Once | ORAL | Status: DC | PRN

## 2021-10-11 MED ORDER — LIDOCAINE HCL (PF) 2 % IJ SOLN
INTRAMUSCULAR | Status: AC
  Filled 2021-10-11: qty 5

## 2021-10-11 MED ORDER — HYDROCODONE-ACETAMINOPHEN 5-325 MG PO TABS: 1.0000 | ORAL_TABLET | Freq: Four times a day (QID) | ORAL | 0 refills | Status: AC | PRN

## 2021-10-11 MED ORDER — MIDAZOLAM HCL 2 MG/2ML IJ SOLN
INTRAMUSCULAR | Status: AC
  Filled 2021-10-11: qty 2

## 2021-10-11 MED ORDER — ONDANSETRON HCL 4 MG/2ML IJ SOLN
INTRAMUSCULAR | Status: AC
  Filled 2021-10-11: qty 2

## 2021-10-11 MED ORDER — ACETAMINOPHEN 500 MG PO TABS
ORAL_TABLET | ORAL | Status: AC
  Administered 2021-10-11: 1000 mg via ORAL
  Filled 2021-10-11: qty 2

## 2021-10-11 MED ORDER — LIDOCAINE HCL (CARDIAC) PF 100 MG/5ML IV SOSY
PREFILLED_SYRINGE | INTRAVENOUS | Status: DC | PRN
  Administered 2021-10-11: 100 mg via INTRAVENOUS

## 2021-10-11 MED ORDER — GABAPENTIN 300 MG PO CAPS: 300.0000 mg | ORAL_CAPSULE | ORAL | Status: AC

## 2021-10-11 MED ORDER — ACETAMINOPHEN 500 MG PO TABS: 1000.0000 mg | ORAL_TABLET | ORAL | Status: AC

## 2021-10-11 MED ORDER — FENTANYL CITRATE (PF) 100 MCG/2ML IJ SOLN: 25.0000 ug | INTRAMUSCULAR | Status: DC | PRN

## 2021-10-11 MED ORDER — PROMETHAZINE HCL 25 MG/ML IJ SOLN: 6.2500 mg | INTRAMUSCULAR | Status: DC | PRN

## 2021-10-11 MED ORDER — DEXAMETHASONE SODIUM PHOSPHATE 10 MG/ML IJ SOLN
INTRAMUSCULAR | Status: DC | PRN
  Administered 2021-10-11: 10 mg via INTRAVENOUS

## 2021-10-11 MED ORDER — ONDANSETRON HCL 4 MG/2ML IJ SOLN
INTRAMUSCULAR | Status: DC | PRN
  Administered 2021-10-11: 4 mg via INTRAVENOUS

## 2021-10-11 MED ORDER — DEXAMETHASONE SODIUM PHOSPHATE 10 MG/ML IJ SOLN
INTRAMUSCULAR | Status: AC
  Filled 2021-10-11: qty 1

## 2021-10-11 MED ORDER — IBUPROFEN 800 MG PO TABS: 800.0000 mg | ORAL_TABLET | Freq: Three times a day (TID) | ORAL | 0 refills | Status: AC | PRN

## 2021-10-11 MED ORDER — ACETAMINOPHEN 10 MG/ML IV SOLN: 1000.0000 mg | Freq: Once | INTRAVENOUS | Status: DC | PRN

## 2021-10-11 MED ORDER — 0.9 % SODIUM CHLORIDE (POUR BTL) OPTIME
TOPICAL | Status: DC | PRN
  Administered 2021-10-11: 500 mL

## 2021-10-11 MED ORDER — EPHEDRINE SULFATE (PRESSORS) 50 MG/ML IJ SOLN
INTRAMUSCULAR | Status: DC | PRN
  Administered 2021-10-11 (×2): 10 mg via INTRAVENOUS

## 2021-10-11 MED ORDER — ORAL CARE MOUTH RINSE: 15.0000 mL | Freq: Once | OROMUCOSAL | Status: AC

## 2021-10-11 MED ORDER — CHLORHEXIDINE GLUCONATE 0.12 % MT SOLN
OROMUCOSAL | Status: AC
  Administered 2021-10-11: 15 mL via OROMUCOSAL
  Filled 2021-10-11: qty 15

## 2021-10-11 SURGICAL SUPPLY — 29 items
BLADE SURG 15 STRL LF DISP TIS (BLADE) ×1 IMPLANT
BLADE SURG 15 STRL SS (BLADE) ×1
BNDG ELASTIC 6X5.8 VLCR STR LF (GAUZE/BANDAGES/DRESSINGS) ×1 IMPLANT
CHLORAPREP W/TINT 26 (MISCELLANEOUS) ×2 IMPLANT
DERMABOND ADVANCED (GAUZE/BANDAGES/DRESSINGS) ×1
DERMABOND ADVANCED .7 DNX12 (GAUZE/BANDAGES/DRESSINGS) ×1 IMPLANT
DRAPE LAPAROTOMY 77X122 PED (DRAPES) ×2 IMPLANT
ELECT CAUTERY BLADE TIP 2.5 (TIP) ×2
ELECT REM PT RETURN 9FT ADLT (ELECTROSURGICAL) ×2
ELECTRODE CAUTERY BLDE TIP 2.5 (TIP) ×1 IMPLANT
ELECTRODE REM PT RTRN 9FT ADLT (ELECTROSURGICAL) ×1 IMPLANT
GAUZE 4X4 16PLY ~~LOC~~+RFID DBL (SPONGE) ×2 IMPLANT
GLOVE ORTHO TXT STRL SZ7.5 (GLOVE) ×2 IMPLANT
GOWN STRL REUS W/ TWL LRG LVL3 (GOWN DISPOSABLE) ×1 IMPLANT
GOWN STRL REUS W/ TWL XL LVL3 (GOWN DISPOSABLE) ×1 IMPLANT
GOWN STRL REUS W/TWL LRG LVL3 (GOWN DISPOSABLE) ×1
GOWN STRL REUS W/TWL XL LVL3 (GOWN DISPOSABLE) ×1
KIT TURNOVER KIT A (KITS) ×2 IMPLANT
MANIFOLD NEPTUNE II (INSTRUMENTS) ×2 IMPLANT
NEEDLE HYPO 22GX1.5 SAFETY (NEEDLE) ×2 IMPLANT
PACK BASIN MINOR ARMC (MISCELLANEOUS) ×2 IMPLANT
SUT MNCRL 4-0 (SUTURE) ×1
SUT MNCRL 4-0 27XMFL (SUTURE) ×1
SUT VIC AB 3-0 SH 27 (SUTURE) ×1
SUT VIC AB 3-0 SH 27X BRD (SUTURE) ×1 IMPLANT
SUTURE MNCRL 4-0 27XMF (SUTURE) ×1 IMPLANT
SYR 10ML LL (SYRINGE) ×2 IMPLANT
SYR BULB IRRIG 60ML STRL (SYRINGE) ×2 IMPLANT
WATER STERILE IRR 500ML POUR (IV SOLUTION) ×2 IMPLANT

## 2021-10-11 NOTE — Anesthesia Preprocedure Evaluation (Addendum)
Anesthesia Evaluation  ?Patient identified by MRN, date of birth, ID band ?Patient awake ? ? ? ?Reviewed: ?Allergy & Precautions, NPO status , Patient's Chart, lab work & pertinent test results ? ?Airway ?Mallampati: II ? ?TM Distance: >3 FB ?Neck ROM: full ? ? ? Dental ?no notable dental hx. ? ?  ?Pulmonary ?neg pulmonary ROS,  ?  ?Pulmonary exam normal ? ? ? ? ? ? ? Cardiovascular ?negative cardio ROS ?Normal cardiovascular exam ? ? ?  ?Neuro/Psych ? Headaches, negative psych ROS  ? GI/Hepatic ?Neg liver ROS, GERD  Controlled and Medicated,  ?Endo/Other  ?negative endocrine ROS ? Renal/GU ?  ? ?  ?Musculoskeletal ? ? Abdominal ?(+) + obese,   ?Peds ? Hematology ?negative hematology ROS ?(+)   ?Anesthesia Other Findings ?palpable soft tissue mass 8 - 9 cm ? ?Past Medical History: ?No date: Anemia ?No date: Headache ?No date: Pneumonia ? ?Past Surgical History: ?No date: DILATION AND CURETTAGE OF UTERUS ?No date: TUBAL LIGATION ? ? ? ? Reproductive/Obstetrics ?negative OB ROS ? ?  ? ? ? ? ? ? ? ? ? ? ? ? ? ?  ?  ? ? ? ? ? ? ? ?Anesthesia Physical ?Anesthesia Plan ? ?ASA: 2 ? ?Anesthesia Plan: General LMA  ? ?Post-op Pain Management: Celebrex PO (pre-op)*, Tylenol PO (pre-op)* and Gabapentin PO (pre-op)*  ? ?Induction: Intravenous ? ?PONV Risk Score and Plan: Dexamethasone, Ondansetron, Midazolam and Treatment may vary due to age or medical condition ? ?Airway Management Planned: LMA ? ?Additional Equipment:  ? ?Intra-op Plan:  ? ?Post-operative Plan: Extubation in OR ? ?Informed Consent: I have reviewed the patients History and Physical, chart, labs and discussed the procedure including the risks, benefits and alternatives for the proposed anesthesia with the patient or authorized representative who has indicated his/her understanding and acceptance.  ? ? ? ?Dental advisory given ? ?Plan Discussed with: Anesthesiologist, CRNA and Surgeon ? ?Anesthesia Plan Comments:    ? ? ? ? ? ?Anesthesia Quick Evaluation ? ?

## 2021-10-11 NOTE — Interval H&P Note (Signed)
History and Physical Interval Note: ? ?10/11/2021 ?10:09 AM ? ?Cathy Cooper  has presented today for surgery, with the diagnosis of palpable soft tissue mass 8 - 9 cm.  The various methods of treatment have been discussed with the patient and family. After consideration of risks, benefits and other options for treatment, the patient has consented to  Procedure(s): ?EXCISION MASS, soft tissue left thigh (Left) as a surgical intervention.  The patient's history has been reviewed, patient examined, no change in status, stable for surgery.  I have reviewed the patient's chart and labs.  Questions were answered to the patient's satisfaction.   ?The left thigh is marked.  ? ? ?Ronny Bacon ? ? ?

## 2021-10-11 NOTE — Op Note (Signed)
Excision of 9 cm left medial proximal thigh soft tissue mass. ? ?Pre-operative Diagnosis: 9 cm left medial proximal thigh soft tissue mass. ? ?Post-operative Diagnosis: same.  Consistent with mature multilobulated lipoma. ? ?Surgeon: Ronny Bacon, M.D., FACS ? ?Anesthesia: General ? ?Findings: Multilobulated 9+ centimeters soft tissue mass consistent with mature lipoma, immediate proximity/involvement or origin of immediate subdermal soft tissue, necessitating excision of overlying skin. ? ?Estimated Blood Loss: 5 mL ?        ?Specimens: Multilobulated lipomatous mass and adjacent skin from left proximal medial thigh.    ?      ?Complications: none ?        ?     ?Procedure Details  ?The patient was seen again in the Holding Room. The benefits, complications, treatment options, and expected outcomes were discussed with the patient. The risks of bleeding, infection, recurrence of symptoms, failure to resolve symptoms, unanticipated injury, prosthetic placement, prosthetic infection, any of which could require further surgery were reviewed with the patient. The likelihood of improving the patient's symptoms with return to their baseline status is expected.  The patient and/or family concurred with the proposed plan, giving informed consent.  The patient was taken to Operating Room, identified and the procedure verified.   ? ?Prior to the induction of general anesthesia, antibiotic prophylaxis was administered. VTE prophylaxis was in place.  General anesthesia was then administered and tolerated well. After the induction, the patient was positioned in the supine/frog-leg position and the left proximal medial thigh was prepped with  Chloraprep and draped in the sterile fashion.  ?A Time Out was held and the above information confirmed. ? ?With a dermal involvement was most proximate to the lipomatous lesion with very little intervening space or mobility and excision of skin and underlying soft tissue mass was  initiated, providing the long axis of the incision to be parallel with the limb long axis.  Because of the irregularity of the lipomatous lesion, multiple septae were divided with blunt dissection and electrosurgery.  Excellent hemostasis was maintained throughout the procedure.  I believe all the abnormal lipomatous tissue was removed.  This left an unusual shaped skin incision surprisingly, and is appear to come together most cosmetically in a T or Y-shaped closure.  This was completed with interrupted dermal 3-0 Vicryl and a running 4-0 subcuticular of Monocryl.  Local infiltration of anesthesia was completed for pain postop pain control.  And the skin was sealed with Dermabond. ?We then wrapped the leg with 6 inch wide Ace wrap for support. ? ? ? ? ? ?Ronny Bacon M.D., FACS ?[facility] ?10/11/2021 11:40 AM  ? ?

## 2021-10-11 NOTE — Discharge Instructions (Signed)
AMBULATORY SURGERY  ?DISCHARGE INSTRUCTIONS ? ? ?The drugs that you were given will stay in your system until tomorrow so for the next 24 hours you should not: ? ?Drive an automobile ?Make any legal decisions ?Drink any alcoholic beverage ? ? ?You may resume regular meals tomorrow.  Today it is better to start with liquids and gradually work up to solid foods. ? ?You may eat anything you prefer, but it is better to start with liquids, then soup and crackers, and gradually work up to solid foods. ? ? ?Please notify your doctor immediately if you have any unusual bleeding, trouble breathing, redness and pain at the surgery site, drainage, fever, or pain not relieved by medication. ? ? ? ?Additional Instructions: ? ? ? ?Please contact your physician with any problems or Same Day Surgery at [phone], Monday through Friday 6 am to 4 pm, or [facility] at [facility] number at [phone].  ?

## 2021-10-11 NOTE — Transfer of Care (Signed)
Immediate Anesthesia Transfer of Care Note ? ?Patient: Cathy Cooper ? ?Procedure(s) Performed: EXCISION MASS, soft tissue left thigh (Left: Leg Upper) ? ?Patient Location: PACU ? ?Anesthesia Type:General ? ?Level of Consciousness: drowsy and patient cooperative ? ?Airway & Oxygen Therapy: Patient Spontanous Breathing and Patient connected to nasal cannula oxygen ? ?Post-op Assessment: Report given to RN and Post -op Vital signs reviewed and stable ? ?Post vital signs: Reviewed and stable ? ?Last Vitals:  ?Vitals Value Taken Time  ?BP 112/71 10/11/21 1131  ?Temp    ?Pulse 96 10/11/21 1131  ?Resp 24 10/11/21 1131  ?SpO2 99 % 10/11/21 1131  ?Vitals shown include unvalidated device data. ? ?Last Pain:  ?Vitals:  ? 10/11/21 0958  ?TempSrc: Temporal  ?PainSc: 0-No pain  ?   ? ?  ? ?Complications: No notable events documented. ?

## 2021-10-11 NOTE — Anesthesia Procedure Notes (Signed)
Procedure Name: LMA Insertion ?Date/Time: 10/11/2021 10:34 AM ?Performed by: Natasha Mead, CRNA ?Pre-anesthesia Checklist: Patient identified, Emergency Drugs available, Suction available, Patient being monitored and Timeout performed ?Patient Re-evaluated:Patient Re-evaluated prior to induction ?Oxygen Delivery Method: Circle system utilized ?Preoxygenation: Pre-oxygenation with 100% oxygen ?Induction Type: IV induction ?Ventilation: Mask ventilation without difficulty ?LMA: LMA inserted ?LMA Size: 4.0 ?Tube secured with: Tape ?Dental Injury: Teeth and Oropharynx as per pre-operative assessment  ? ? ? ? ?

## 2021-10-12 ENCOUNTER — Encounter: Payer: Self-pay | Admitting: Surgery

## 2021-10-12 NOTE — Anesthesia Postprocedure Evaluation (Signed)
Anesthesia Post Note ? ?Patient: ADRIYANNA CHRISTIANS ? ?Procedure(s) Performed: EXCISION MASS, soft tissue left thigh (Left: Leg Upper) ? ?Patient location during evaluation: PACU ?Anesthesia Type: General ?Level of consciousness: awake and alert ?Pain management: pain level controlled ?Vital Signs Assessment: post-procedure vital signs reviewed and stable ?Respiratory status: spontaneous breathing, nonlabored ventilation and respiratory function stable ?Cardiovascular status: blood pressure returned to baseline and stable ?Postop Assessment: no apparent nausea or vomiting ?Anesthetic complications: no ? ? ?No notable events documented. ? ? ?Last Vitals:  ?Vitals:  ? 10/11/21 1145 10/11/21 1153  ?BP: 115/73 120/80  ?Pulse: 96 89  ?Resp: 15 15  ?Temp: (!) 36.2 ?C (!) 36.3 ?C  ?SpO2: 100% 95%  ?  ?Last Pain:  ?Vitals:  ? 10/11/21 1153  ?TempSrc: Oral  ?PainSc: 0-No pain  ? ? ?  ?  ?  ?  ?  ?  ? ?[place] Ouch ? ? ? ? ?

## 2021-10-13 LAB — SURGICAL PATHOLOGY

## 2021-10-24 ENCOUNTER — Encounter: Payer: BC Managed Care – PPO | Admitting: Physician Assistant

## 2021-11-01 ENCOUNTER — Encounter: Payer: BC Managed Care – PPO | Admitting: Licensed Practical Nurse

## 2021-11-07 ENCOUNTER — Encounter: Payer: Self-pay | Admitting: Surgery

## 2021-11-08 ENCOUNTER — Encounter: Payer: BC Managed Care – PPO | Admitting: Licensed Practical Nurse

## 2022-03-06 ENCOUNTER — Encounter: Payer: Self-pay | Admitting: Obstetrics and Gynecology

## 2022-03-06 DIAGNOSIS — Z01419 Encounter for gynecological examination (general) (routine) without abnormal findings: Secondary | ICD-10-CM

## 2022-03-06 DIAGNOSIS — Z1231 Encounter for screening mammogram for malignant neoplasm of breast: Secondary | ICD-10-CM
